# Patient Record
Sex: Female | Born: 1940 | Race: White | Hispanic: No | Marital: Single | Smoking: Former smoker
Health system: Southern US, Community
[De-identification: ages and names within clinical notes are randomized; demographics above are authoritative.]

## PROBLEM LIST (undated history)

## (undated) ENCOUNTER — Inpatient Hospital Stay (AMBULATORY_SURGERY_CENTER): Payer: Medicare Other | Admitting: Podiatry

## (undated) DIAGNOSIS — Z8719 Personal history of other diseases of the digestive system: Secondary | ICD-10-CM

## (undated) DIAGNOSIS — F329 Major depressive disorder, single episode, unspecified: Secondary | ICD-10-CM

## (undated) DIAGNOSIS — I10 Essential (primary) hypertension: Secondary | ICD-10-CM

## (undated) DIAGNOSIS — D649 Anemia, unspecified: Secondary | ICD-10-CM

## (undated) DIAGNOSIS — J45909 Unspecified asthma, uncomplicated: Secondary | ICD-10-CM

## (undated) DIAGNOSIS — F32A Depression, unspecified: Secondary | ICD-10-CM

## (undated) HISTORY — DX: Unspecified asthma, uncomplicated: J45.909

## (undated) HISTORY — PX: NASAL SINUS SURGERY: SHX719

## (undated) HISTORY — PX: TONSILLECTOMY: SUR1361

## (undated) HISTORY — PX: BACK SURGERY: SHX140

---

## 1972-01-19 HISTORY — PX: CHOLECYSTECTOMY: SHX55

## 1977-01-18 HISTORY — PX: ABDOMINAL HYSTERECTOMY: SHX81

## 1992-01-19 HISTORY — PX: HAND TENDON SURGERY: SHX663

## 1998-01-18 HISTORY — PX: ROUX-EN-Y PROCEDURE: SUR1287

## 1999-01-19 HISTORY — PX: EYE SURGERY: SHX253

## 2005-11-11 ENCOUNTER — Ambulatory Visit (HOSPITAL_COMMUNITY): Admission: RE | Admit: 2005-11-11 | Discharge: 2005-11-11 | Payer: Self-pay | Admitting: Neurosurgery

## 2005-11-26 ENCOUNTER — Ambulatory Visit (HOSPITAL_COMMUNITY): Admission: RE | Admit: 2005-11-26 | Discharge: 2005-11-27 | Payer: Self-pay | Admitting: Neurosurgery

## 2010-01-18 HISTORY — PX: HAMMER TOE SURGERY: SHX385

## 2014-09-02 ENCOUNTER — Other Ambulatory Visit: Payer: Self-pay | Admitting: Orthopedic Surgery

## 2014-09-02 DIAGNOSIS — M5136 Other intervertebral disc degeneration, lumbar region: Secondary | ICD-10-CM

## 2014-09-10 ENCOUNTER — Ambulatory Visit
Admission: RE | Admit: 2014-09-10 | Discharge: 2014-09-10 | Disposition: A | Discharge status: Home / Self Care | Payer: Medicare Other | Source: Ambulatory Visit | Attending: Orthopedic Surgery | Admitting: Orthopedic Surgery

## 2014-09-10 DIAGNOSIS — M5136 Other intervertebral disc degeneration, lumbar region: Secondary | ICD-10-CM

## 2014-09-10 MED ORDER — DIAZEPAM 5 MG PO TABS
5.0000 mg | ORAL_TABLET | Freq: Once | ORAL | Status: AC
  Administered 2014-09-10: 5 mg via ORAL

## 2014-09-10 MED ORDER — IOHEXOL 180 MG/ML  SOLN
17.0000 mL | Freq: Once | INTRAMUSCULAR | Status: DC | PRN
  Administered 2014-09-10: 17 mL via INTRATHECAL

## 2014-09-10 NOTE — Discharge Instructions (Signed)

## 2015-02-12 ENCOUNTER — Other Ambulatory Visit: Payer: Self-pay | Admitting: Neurosurgery

## 2015-02-19 ENCOUNTER — Other Ambulatory Visit (HOSPITAL_COMMUNITY): Payer: Self-pay | Admitting: *Deleted

## 2015-02-19 ENCOUNTER — Encounter (HOSPITAL_COMMUNITY): Payer: Self-pay

## 2015-02-19 ENCOUNTER — Encounter (HOSPITAL_COMMUNITY)
Admission: RE | Admit: 2015-02-19 | Discharge: 2015-02-19 | Disposition: A | Discharge status: Home / Self Care | Payer: Medicare Other | Source: Ambulatory Visit | Attending: Neurosurgery | Admitting: Neurosurgery

## 2015-02-19 DIAGNOSIS — M4316 Spondylolisthesis, lumbar region: Secondary | ICD-10-CM | POA: Diagnosis not present

## 2015-02-19 DIAGNOSIS — Z79899 Other long term (current) drug therapy: Secondary | ICD-10-CM | POA: Insufficient documentation

## 2015-02-19 DIAGNOSIS — Z01818 Encounter for other preprocedural examination: Secondary | ICD-10-CM | POA: Diagnosis not present

## 2015-02-19 DIAGNOSIS — Z0183 Encounter for blood typing: Secondary | ICD-10-CM | POA: Insufficient documentation

## 2015-02-19 DIAGNOSIS — Z87891 Personal history of nicotine dependence: Secondary | ICD-10-CM | POA: Diagnosis not present

## 2015-02-19 DIAGNOSIS — Z01812 Encounter for preprocedural laboratory examination: Secondary | ICD-10-CM | POA: Insufficient documentation

## 2015-02-19 DIAGNOSIS — I10 Essential (primary) hypertension: Secondary | ICD-10-CM | POA: Insufficient documentation

## 2015-02-19 HISTORY — DX: Anemia, unspecified: D64.9

## 2015-02-19 HISTORY — DX: Essential (primary) hypertension: I10

## 2015-02-19 HISTORY — DX: Personal history of other diseases of the digestive system: Z87.19

## 2015-02-19 HISTORY — DX: Depression, unspecified: F32.A

## 2015-02-19 HISTORY — DX: Major depressive disorder, single episode, unspecified: F32.9

## 2015-02-19 LAB — CBC
HEMATOCRIT: 39.9 % (ref 36.0–46.0)
HEMOGLOBIN: 13.1 g/dL (ref 12.0–15.0)
MCH: 28.6 pg (ref 26.0–34.0)
MCHC: 32.8 g/dL (ref 30.0–36.0)
MCV: 87.1 fL (ref 78.0–100.0)
Platelets: 309 10*3/uL (ref 150–400)
RBC: 4.58 MIL/uL (ref 3.87–5.11)
RDW: 13.5 % (ref 11.5–15.5)
WBC: 7.5 10*3/uL (ref 4.0–10.5)

## 2015-02-19 LAB — BASIC METABOLIC PANEL
ANION GAP: 8 (ref 5–15)
BUN: <5 mg/dL — ABNORMAL LOW (ref 6–20)
CHLORIDE: 104 mmol/L (ref 101–111)
CO2: 28 mmol/L (ref 22–32)
Calcium: 9.2 mg/dL (ref 8.9–10.3)
Creatinine, Ser: 0.71 mg/dL (ref 0.44–1.00)
GFR calc non Af Amer: >60 mL/min (ref >60)
Glucose, Bld: 101 mg/dL — ABNORMAL HIGH (ref 65–99)
POTASSIUM: 3.6 mmol/L (ref 3.5–5.1)
SODIUM: 140 mmol/L (ref 135–145)

## 2015-02-19 LAB — SURGICAL PCR SCREEN
MRSA, PCR: NEGATIVE
Staphylococcus aureus: NEGATIVE

## 2015-02-19 LAB — TYPE AND SCREEN
ABO/RH(D): O POS
Antibody Screen: NEGATIVE

## 2015-02-19 LAB — ABO/RH: ABO/RH(D): O POS

## 2015-02-19 NOTE — Pre-Procedure Instructions (Signed)
Angela Davidson  02/19/2015     Your procedure is scheduled on Thursday, February 27, 2015 at 12:35 PM.   Report to [facility] Entrance "A" Admitting Office at 10:30 AM.   Call this number if you have problems the morning of surgery: [phone]   Any questions prior to day of surgery, please call [phone] between 8 & 4 PM.   Remember:  Do not eat food or drink liquids after midnight Wednesday, 02/26/15.    Do not wear jewelry, make-up or nail polish.  Do not wear lotions, powders, or perfumes.  You may wear deodorant.  Do not shave 48 hours prior to surgery.    Do not bring valuables to the hospital.  [facility] is not responsible for any belongings or valuables.  Contacts, dentures or bridgework may not be worn into surgery.  Leave your suitcase in the car.  After surgery it may be brought to your room.  For patients admitted to the hospital, discharge time will be determined by your treatment team.  Special instructions:  [facility] - Preparing for Surgery  Before surgery, you can play an important role.  Because skin is not sterile, your skin needs to be as free of germs as possible.  You can reduce the number of germs on you skin by washing with CHG (chlorahexidine gluconate) soap before surgery.  CHG is an antiseptic cleaner which kills germs and bonds with the skin to continue killing germs even after washing.  Please DO NOT use if you have an allergy to CHG or antibacterial soaps.  If your skin becomes reddened/irritated stop using the CHG and inform your nurse when you arrive at Short Stay.  Do not shave (including legs and underarms) for at least 48 hours prior to the first CHG shower.  You may shave your face.  Please follow these instructions carefully:   1.  Shower with CHG Soap the night before surgery and the                                morning of Surgery.  2.  If you choose to wash your hair, wash your hair first as usual with your       normal  shampoo.  3.  After you shampoo, rinse your hair and body thoroughly to remove the                      Shampoo.  4.  Use CHG as you would any other liquid soap.  You can apply chg directly       to the skin and wash gently with scrungie or a clean washcloth.  5.  Apply the CHG Soap to your body ONLY FROM THE NECK DOWN.        Do not use on open wounds or open sores.  Avoid contact with your eyes, ears, mouth and genitals (private parts).  Wash genitals (private parts) with your normal soap.  6.  Wash thoroughly, paying special attention to the area where your surgery        will be performed.  7.  Thoroughly rinse your body with warm water from the neck down.  8.  DO NOT shower/wash with your normal soap after using and rinsing off       the CHG Soap.  9.  Pat yourself dry with a clean towel.  10.  Wear clean pajamas.            11.  Place clean sheets on your bed the night of your first shower and do not        sleep with pets.  Day of Surgery  Do not apply any lotions the morning of surgery.  Please wear clean clothes to the hospital.   Please read over the following fact sheets that you were given. Pain Booklet, Coughing and Deep Breathing, Blood Transfusion Information, MRSA Information and Surgical Site Infection Prevention

## 2015-02-19 NOTE — Progress Notes (Signed)
Pt denies cardiac history, chest pain or sob. 

## 2015-02-20 NOTE — Progress Notes (Addendum)
Anesthesia Chart Review:  Pt is a 75 year old female scheduled for L4-5 PLIF on 02/27/2015 with Dr. Christella Noa.   PCP is Dr. Gilford Rile.  PMH includes:  HTN, anemia. Former smoker. BMI 23.5  Medications include: losartan.  Preoperative labs reviewed.    EKG 02/19/15: NSR. Non-specific ST-t changes. Poor R wave progression. Since previous tracing 11/24/05 lateral ST segments are depressed  If no changes, I anticipate pt can proceed with surgery as scheduled.   Willeen Cass, FNP-BC [facility] Short Stay Surgical Center/Anesthesiology Phone: [phone] 02/20/2015 2:40 PM

## 2015-02-26 MED ORDER — CEFAZOLIN SODIUM-DEXTROSE 2-3 GM-% IV SOLR
2.0000 g | INTRAVENOUS | Status: AC
  Administered 2015-02-27: 2 g via INTRAVENOUS
  Filled 2015-02-26: qty 50

## 2015-02-27 ENCOUNTER — Encounter (HOSPITAL_COMMUNITY)
Admission: RE | Disposition: A | Discharge status: Home / Self Care | Payer: Self-pay | Source: Ambulatory Visit | Attending: Neurosurgery

## 2015-02-27 ENCOUNTER — Inpatient Hospital Stay (HOSPITAL_COMMUNITY): Payer: Medicare Other | Admitting: Anesthesiology

## 2015-02-27 ENCOUNTER — Inpatient Hospital Stay (HOSPITAL_COMMUNITY): Payer: Medicare Other | Admitting: Emergency Medicine

## 2015-02-27 ENCOUNTER — Inpatient Hospital Stay (HOSPITAL_COMMUNITY)
Admission: RE | Admit: 2015-02-27 | Discharge: 2015-03-02 | DRG: 460 | Disposition: A | Discharge status: Home / Self Care | Payer: Medicare Other | Source: Ambulatory Visit | Attending: Neurosurgery | Admitting: Neurosurgery

## 2015-02-27 ENCOUNTER — Encounter (HOSPITAL_COMMUNITY): Payer: Self-pay | Admitting: Anesthesiology

## 2015-02-27 ENCOUNTER — Inpatient Hospital Stay (HOSPITAL_COMMUNITY): Payer: Medicare Other

## 2015-02-27 DIAGNOSIS — Z90710 Acquired absence of both cervix and uterus: Secondary | ICD-10-CM

## 2015-02-27 DIAGNOSIS — M4806 Spinal stenosis, lumbar region: Principal | ICD-10-CM | POA: Diagnosis present

## 2015-02-27 DIAGNOSIS — Z9849 Cataract extraction status, unspecified eye: Secondary | ICD-10-CM

## 2015-02-27 DIAGNOSIS — Z419 Encounter for procedure for purposes other than remedying health state, unspecified: Secondary | ICD-10-CM

## 2015-02-27 DIAGNOSIS — Z9884 Bariatric surgery status: Secondary | ICD-10-CM

## 2015-02-27 DIAGNOSIS — Z8249 Family history of ischemic heart disease and other diseases of the circulatory system: Secondary | ICD-10-CM

## 2015-02-27 DIAGNOSIS — M549 Dorsalgia, unspecified: Secondary | ICD-10-CM | POA: Diagnosis present

## 2015-02-27 DIAGNOSIS — M4316 Spondylolisthesis, lumbar region: Secondary | ICD-10-CM | POA: Diagnosis present

## 2015-02-27 DIAGNOSIS — I10 Essential (primary) hypertension: Secondary | ICD-10-CM | POA: Diagnosis present

## 2015-02-27 SURGERY — POSTERIOR LUMBAR FUSION 1 LEVEL
Anesthesia: General

## 2015-02-27 MED ORDER — VITAMIN D 1000 UNITS PO TABS
5000.0000 [IU] | ORAL_TABLET | Freq: Every day | ORAL | Status: DC
  Administered 2015-02-27 – 2015-03-02 (×4): 5000 [IU] via ORAL
  Filled 2015-02-27 (×4): qty 5

## 2015-02-27 MED ORDER — HYDROMORPHONE HCL 1 MG/ML IJ SOLN
0.2500 mg | INTRAMUSCULAR | Status: DC | PRN
  Administered 2015-02-27 (×2): 0.5 mg via INTRAVENOUS

## 2015-02-27 MED ORDER — 0.9 % SODIUM CHLORIDE (POUR BTL) OPTIME
TOPICAL | Status: DC | PRN
  Administered 2015-02-27: 1000 mL

## 2015-02-27 MED ORDER — ACETAMINOPHEN 325 MG PO TABS
650.0000 mg | ORAL_TABLET | ORAL | Status: DC | PRN
  Administered 2015-03-01 – 2015-03-02 (×3): 650 mg via ORAL
  Filled 2015-02-27 (×3): qty 2

## 2015-02-27 MED ORDER — SENNOSIDES-DOCUSATE SODIUM 8.6-50 MG PO TABS: 1.0000 | ORAL_TABLET | Freq: Every evening | ORAL | Status: DC | PRN

## 2015-02-27 MED ORDER — ONDANSETRON HCL 4 MG/2ML IJ SOLN
INTRAMUSCULAR | Status: AC
  Filled 2015-02-27: qty 2

## 2015-02-27 MED ORDER — DOCUSATE SODIUM 100 MG PO CAPS
100.0000 mg | ORAL_CAPSULE | Freq: Two times a day (BID) | ORAL | Status: DC
  Administered 2015-02-27 – 2015-03-02 (×6): 100 mg via ORAL
  Filled 2015-02-27 (×6): qty 1

## 2015-02-27 MED ORDER — FENTANYL CITRATE (PF) 250 MCG/5ML IJ SOLN
INTRAMUSCULAR | Status: AC
  Filled 2015-02-27: qty 5

## 2015-02-27 MED ORDER — TRAMADOL HCL 50 MG PO TABS: 50.0000 mg | ORAL_TABLET | Freq: Four times a day (QID) | ORAL | Status: DC | PRN

## 2015-02-27 MED ORDER — ROCURONIUM BROMIDE 50 MG/5ML IV SOLN
INTRAVENOUS | Status: AC
  Filled 2015-02-27: qty 1

## 2015-02-27 MED ORDER — POTASSIUM CHLORIDE IN NACL 20-0.9 MEQ/L-% IV SOLN
INTRAVENOUS | Status: DC
  Administered 2015-02-27: 1000 mL via INTRAVENOUS
  Filled 2015-02-27: qty 1000

## 2015-02-27 MED ORDER — PHENOL 1.4 % MT LIQD
1.0000 | OROMUCOSAL | Status: DC | PRN
Start: 2015-02-27 — End: 2015-03-02

## 2015-02-27 MED ORDER — LACTATED RINGERS IV SOLN
INTRAVENOUS | Status: DC
  Administered 2015-02-27 (×5): via INTRAVENOUS

## 2015-02-27 MED ORDER — HYDROMORPHONE HCL 1 MG/ML IJ SOLN
0.5000 mg | INTRAMUSCULAR | Status: DC | PRN
  Administered 2015-02-27: 1 mg via INTRAVENOUS
  Filled 2015-02-27: qty 1

## 2015-02-27 MED ORDER — SODIUM CHLORIDE 0.9% FLUSH
3.0000 mL | Freq: Two times a day (BID) | INTRAVENOUS | Status: DC
  Administered 2015-02-27 – 2015-03-02 (×5): 3 mL via INTRAVENOUS

## 2015-02-27 MED ORDER — BIOTIN 10 MG PO TABS: 1.0000 | ORAL_TABLET | Freq: Every day | ORAL | Status: DC

## 2015-02-27 MED ORDER — LIDOCAINE HCL (CARDIAC) 20 MG/ML IV SOLN
INTRAVENOUS | Status: DC | PRN
  Administered 2015-02-27: 50 mg via INTRAVENOUS

## 2015-02-27 MED ORDER — ONDANSETRON HCL 4 MG/2ML IJ SOLN: 4.0000 mg | INTRAMUSCULAR | Status: DC | PRN

## 2015-02-27 MED ORDER — ACETAMINOPHEN 650 MG RE SUPP: 650.0000 mg | RECTAL | Status: DC | PRN

## 2015-02-27 MED ORDER — PHENYLEPHRINE HCL 10 MG/ML IJ SOLN
INTRAMUSCULAR | Status: AC
  Filled 2015-02-27: qty 1

## 2015-02-27 MED ORDER — ROCURONIUM BROMIDE 100 MG/10ML IV SOLN
INTRAVENOUS | Status: DC | PRN
  Administered 2015-02-27: 50 mg via INTRAVENOUS

## 2015-02-27 MED ORDER — ONDANSETRON HCL 4 MG/2ML IJ SOLN
INTRAMUSCULAR | Status: DC | PRN
  Administered 2015-02-27: 4 mg via INTRAVENOUS

## 2015-02-27 MED ORDER — HYDROCODONE-ACETAMINOPHEN 5-325 MG PO TABS: 1.0000 | ORAL_TABLET | ORAL | Status: DC | PRN

## 2015-02-27 MED ORDER — SODIUM CHLORIDE 0.9 % IV SOLN: 250.0000 mL | INTRAVENOUS | Status: DC

## 2015-02-27 MED ORDER — PROPOFOL 10 MG/ML IV BOLUS
INTRAVENOUS | Status: AC
  Filled 2015-02-27: qty 20

## 2015-02-27 MED ORDER — OXYCODONE-ACETAMINOPHEN 5-325 MG PO TABS
1.0000 | ORAL_TABLET | ORAL | Status: DC | PRN
  Administered 2015-02-28 (×2): 1 via ORAL
  Filled 2015-02-27: qty 1
  Filled 2015-02-27: qty 2

## 2015-02-27 MED ORDER — MIDAZOLAM HCL 2 MG/2ML IJ SOLN
INTRAMUSCULAR | Status: AC
  Filled 2015-02-27: qty 2

## 2015-02-27 MED ORDER — LOSARTAN POTASSIUM 50 MG PO TABS
50.0000 mg | ORAL_TABLET | Freq: Every day | ORAL | Status: DC
Start: 2015-02-27 — End: 2015-03-02
  Administered 2015-02-27 – 2015-03-02 (×3): 50 mg via ORAL
  Filled 2015-02-27 (×4): qty 1

## 2015-02-27 MED ORDER — METOCLOPRAMIDE HCL 5 MG/ML IJ SOLN
INTRAMUSCULAR | Status: AC
  Filled 2015-02-27: qty 2

## 2015-02-27 MED ORDER — LIDOCAINE-EPINEPHRINE 0.5 %-1:200000 IJ SOLN
INTRAMUSCULAR | Status: DC | PRN
  Administered 2015-02-27: 20 mL

## 2015-02-27 MED ORDER — MENTHOL 3 MG MT LOZG: 1.0000 | LOZENGE | OROMUCOSAL | Status: DC | PRN

## 2015-02-27 MED ORDER — CALCIUM CARBONATE-VITAMIN D 500-200 MG-UNIT PO TABS
1.0000 | ORAL_TABLET | Freq: Every day | ORAL | Status: DC
  Administered 2015-02-27 – 2015-03-02 (×4): 1 via ORAL
  Filled 2015-02-27 (×7): qty 1

## 2015-02-27 MED ORDER — DEXAMETHASONE SODIUM PHOSPHATE 4 MG/ML IJ SOLN
INTRAMUSCULAR | Status: DC | PRN
  Administered 2015-02-27: 8 mg via INTRAVENOUS

## 2015-02-27 MED ORDER — VITAMIN B-12 1000 MCG PO TABS
1000.0000 ug | ORAL_TABLET | Freq: Every day | ORAL | Status: DC
  Administered 2015-02-27 – 2015-03-02 (×4): 1000 ug via ORAL
  Filled 2015-02-27 (×4): qty 1

## 2015-02-27 MED ORDER — LORAZEPAM 1 MG PO TABS
1.0000 mg | ORAL_TABLET | Freq: Every day | ORAL | Status: DC
  Administered 2015-02-27 – 2015-03-01 (×3): 1 mg via ORAL
  Filled 2015-02-27 (×3): qty 1

## 2015-02-27 MED ORDER — SODIUM CHLORIDE 0.9% FLUSH: 3.0000 mL | INTRAVENOUS | Status: DC | PRN

## 2015-02-27 MED ORDER — DEXAMETHASONE SODIUM PHOSPHATE 4 MG/ML IJ SOLN
INTRAMUSCULAR | Status: AC
  Filled 2015-02-27: qty 2

## 2015-02-27 MED ORDER — CYCLOBENZAPRINE HCL 10 MG PO TABS
ORAL_TABLET | ORAL | Status: AC
Start: 2015-02-27 — End: 2015-02-27
  Administered 2015-02-27: 10 mg
  Filled 2015-02-27: qty 1

## 2015-02-27 MED ORDER — THROMBIN 20000 UNITS EX SOLR
CUTANEOUS | Status: DC | PRN
  Administered 2015-02-27: 12:00:00 via TOPICAL

## 2015-02-27 MED ORDER — CYCLOBENZAPRINE HCL 10 MG PO TABS
10.0000 mg | ORAL_TABLET | Freq: Three times a day (TID) | ORAL | Status: DC | PRN
  Administered 2015-02-27: 10 mg via ORAL

## 2015-02-27 MED ORDER — ESCITALOPRAM OXALATE 10 MG PO TABS
5.0000 mg | ORAL_TABLET | Freq: Every day | ORAL | Status: DC
  Administered 2015-02-27 – 2015-03-01 (×3): 5 mg via ORAL
  Filled 2015-02-27 (×3): qty 1

## 2015-02-27 MED ORDER — FENTANYL CITRATE (PF) 100 MCG/2ML IJ SOLN
INTRAMUSCULAR | Status: DC | PRN
  Administered 2015-02-27 (×2): 50 ug via INTRAVENOUS
  Administered 2015-02-27 (×2): 100 ug via INTRAVENOUS
  Administered 2015-02-27 (×2): 50 ug via INTRAVENOUS

## 2015-02-27 MED ORDER — LIDOCAINE HCL (CARDIAC) 20 MG/ML IV SOLN
INTRAVENOUS | Status: AC
  Filled 2015-02-27: qty 5

## 2015-02-27 MED ORDER — SUGAMMADEX SODIUM 500 MG/5ML IV SOLN
INTRAVENOUS | Status: DC | PRN
  Administered 2015-02-27: 100 mg via INTRAVENOUS

## 2015-02-27 MED ORDER — PHENYLEPHRINE HCL 10 MG/ML IJ SOLN
10.0000 mg | INTRAVENOUS | Status: DC | PRN
  Administered 2015-02-27: 50 ug/min via INTRAVENOUS

## 2015-02-27 MED ORDER — PHENYLEPHRINE HCL 10 MG/ML IJ SOLN
INTRAMUSCULAR | Status: DC | PRN
  Administered 2015-02-27: 80 ug via INTRAVENOUS
  Administered 2015-02-27: 40 ug via INTRAVENOUS
  Administered 2015-02-27: 80 ug via INTRAVENOUS

## 2015-02-27 MED ORDER — PROPOFOL 10 MG/ML IV BOLUS
INTRAVENOUS | Status: DC | PRN
  Administered 2015-02-27: 130 mg via INTRAVENOUS

## 2015-02-27 MED ORDER — METOCLOPRAMIDE HCL 5 MG/ML IJ SOLN
10.0000 mg | Freq: Once | INTRAMUSCULAR | Status: AC | PRN
  Administered 2015-02-27: 10 mg via INTRAVENOUS

## 2015-02-27 MED ORDER — ALBUMIN HUMAN 5 % IV SOLN
INTRAVENOUS | Status: DC | PRN
  Administered 2015-02-27: 14:00:00 via INTRAVENOUS

## 2015-02-27 MED ORDER — HYDROMORPHONE HCL 1 MG/ML IJ SOLN
INTRAMUSCULAR | Status: AC
  Filled 2015-02-27: qty 1

## 2015-02-27 MED ORDER — MEPERIDINE HCL 25 MG/ML IJ SOLN: 6.2500 mg | INTRAMUSCULAR | Status: DC | PRN

## 2015-02-27 MED ORDER — MAGNESIUM CITRATE PO SOLN
1.0000 | Freq: Once | ORAL | Status: AC | PRN
  Administered 2015-02-28: 1 via ORAL
  Filled 2015-02-27: qty 296

## 2015-02-27 MED ORDER — MIDAZOLAM HCL 5 MG/5ML IJ SOLN
INTRAMUSCULAR | Status: DC | PRN
  Administered 2015-02-27: 1 mg via INTRAVENOUS

## 2015-02-27 MED ORDER — BISACODYL 5 MG PO TBEC: 5.0000 mg | DELAYED_RELEASE_TABLET | Freq: Every day | ORAL | Status: DC | PRN

## 2015-02-27 SURGICAL SUPPLY — 65 items
BENZOIN TINCTURE PRP APPL 2/3 (GAUZE/BANDAGES/DRESSINGS) IMPLANT
BIT DRILL PLIF MAS DISP 5.5MM (DRILL) ×1 IMPLANT
BLADE CLIPPER SURG (BLADE) IMPLANT
BONE MATRIX VIVIGEN 5CC (Bone Implant) ×3 IMPLANT
BUR MATCHSTICK NEURO 3.0 LAGG (BURR) ×3 IMPLANT
BUR PRECISION FLUTE 5.0 (BURR) ×3 IMPLANT
CANISTER SUCT 3000ML PPV (MISCELLANEOUS) ×3 IMPLANT
CLOSURE WOUND 1/2 X4 (GAUZE/BANDAGES/DRESSINGS)
CONT SPEC 4OZ CLIKSEAL STRL BL (MISCELLANEOUS) ×3 IMPLANT
COVER BACK TABLE 60X90IN (DRAPES) ×3 IMPLANT
DECANTER SPIKE VIAL GLASS SM (MISCELLANEOUS) ×3 IMPLANT
DRAPE C-ARM 42X72 X-RAY (DRAPES) ×6 IMPLANT
DRAPE C-ARMOR (DRAPES) ×6 IMPLANT
DRAPE LAPAROTOMY 100X72X124 (DRAPES) ×3 IMPLANT
DRAPE POUCH INSTRU U-SHP 10X18 (DRAPES) ×3 IMPLANT
DRAPE SURG 17X23 STRL (DRAPES) ×3 IMPLANT
DRILL PLIF MAS DISP 5.5MM (DRILL) ×3
DURAPREP 26ML APPLICATOR (WOUND CARE) ×3 IMPLANT
ELECT REM PT RETURN 9FT ADLT (ELECTROSURGICAL) ×3
ELECTRODE REM PT RTRN 9FT ADLT (ELECTROSURGICAL) ×1 IMPLANT
GAUZE SPONGE 4X4 12PLY STRL (GAUZE/BANDAGES/DRESSINGS) IMPLANT
GAUZE SPONGE 4X4 16PLY XRAY LF (GAUZE/BANDAGES/DRESSINGS) IMPLANT
GLOVE ECLIPSE 6.5 STRL STRAW (GLOVE) ×6 IMPLANT
GLOVE EXAM NITRILE LRG STRL (GLOVE) IMPLANT
GLOVE EXAM NITRILE MD LF STRL (GLOVE) IMPLANT
GLOVE EXAM NITRILE XL STR (GLOVE) IMPLANT
GLOVE EXAM NITRILE XS STR PU (GLOVE) IMPLANT
GLOVE INDICATOR 7.0 STRL GRN (GLOVE) ×6 IMPLANT
GLOVE INDICATOR 7.5 STRL GRN (GLOVE) ×6 IMPLANT
GOWN STRL REUS W/ TWL LRG LVL3 (GOWN DISPOSABLE) ×2 IMPLANT
GOWN STRL REUS W/ TWL XL LVL3 (GOWN DISPOSABLE) ×1 IMPLANT
GOWN STRL REUS W/TWL 2XL LVL3 (GOWN DISPOSABLE) IMPLANT
GOWN STRL REUS W/TWL LRG LVL3 (GOWN DISPOSABLE) ×4
GOWN STRL REUS W/TWL XL LVL3 (GOWN DISPOSABLE) ×2
IV LACTATED RINGERS 500ML (IV SOLUTION) ×3 IMPLANT
KIT BASIN OR (CUSTOM PROCEDURE TRAY) ×3 IMPLANT
KIT POSITION SURG JACKSON T1 (MISCELLANEOUS) ×3 IMPLANT
KIT ROOM TURNOVER OR (KITS) ×3 IMPLANT
LIQUID BAND (GAUZE/BANDAGES/DRESSINGS) ×3 IMPLANT
NEEDLE HYPO 25X1 1.5 SAFETY (NEEDLE) ×3 IMPLANT
NEEDLE SPNL 18GX3.5 QUINCKE PK (NEEDLE) ×3 IMPLANT
NS IRRIG 1000ML POUR BTL (IV SOLUTION) ×6 IMPLANT
PACK LAMINECTOMY NEURO (CUSTOM PROCEDURE TRAY) ×3 IMPLANT
PAD ARMBOARD 7.5X6 YLW CONV (MISCELLANEOUS) ×6 IMPLANT
ROD 30MM (Rod) ×4 IMPLANT
ROD PLIF MAS PB SPHERX 30 (Rod) ×2 IMPLANT
SCREW LOCK (Screw) ×8 IMPLANT
SCREW LOCK FXNS SPNE MAS PL (Screw) ×4 IMPLANT
SCREW SHANK 5.0X35 (Screw) ×3 IMPLANT
SCREW SHANK 5.5X30MM (Screw) ×6 IMPLANT
SCREW SHANKS 5.5X35 (Screw) ×3 IMPLANT
SCREW TULIP 5.5 (Screw) ×12 IMPLANT
SPACER OPAL REVOLVE 10MM ×6 IMPLANT
SPONGE LAP 4X18 X RAY DECT (DISPOSABLE) IMPLANT
SPONGE SURGIFOAM ABS GEL 100 (HEMOSTASIS) ×3 IMPLANT
STRIP CLOSURE SKIN 1/2X4 (GAUZE/BANDAGES/DRESSINGS) IMPLANT
SUT PROLENE 6 0 BV (SUTURE) IMPLANT
SUT VIC AB 0 CT1 18XCR BRD8 (SUTURE) ×2 IMPLANT
SUT VIC AB 0 CT1 8-18 (SUTURE) ×4
SUT VIC AB 2-0 CT1 18 (SUTURE) ×3 IMPLANT
SUT VIC AB 3-0 SH 8-18 (SUTURE) ×3 IMPLANT
TOWEL OR 17X24 6PK STRL BLUE (TOWEL DISPOSABLE) ×3 IMPLANT
TOWEL OR 17X26 10 PK STRL BLUE (TOWEL DISPOSABLE) ×3 IMPLANT
TRAY FOLEY W/METER SILVER 14FR (SET/KITS/TRAYS/PACK) ×3 IMPLANT
WATER STERILE IRR 1000ML POUR (IV SOLUTION) ×3 IMPLANT

## 2015-02-27 NOTE — H&P (Signed)
BP 159/76 mmHg  Pulse 58  Temp(Src) 98.3 F (36.8 C) (Oral)  Resp 20  Ht 5\' 1"  (1.549 m)  Wt 56.246 kg (124 lb)  BMI 23.44 kg/m2  SpO2 100%   Angela Davidson is a patient of mine who I did a lumbar decompression on in 2007 at L4-5.  She returned today for a history of pain in the hip since 06/2014.  There was no antecede in trauma.  She has significant pain in both hips, and pain that runs into the groin on the right side.  She was seen and evaluated by Dr. Gladstone Lighter.  The evaluation included a myelogram/post myelogram CT, which did show a spondylolisthesis with movement on flexion and extension films.  She is mildly stenotic at this area, and does have some again, mild foraminal compromise.  Given these findings, it seems that the L4-5 region is the one of most interest.  She does not have pain that radiates to the knees on either side.  She says that the pain that she feels in the right groin, used to be pain that was going into the left groin.  She is 75 years of age, retired, and right-handed.        REVIEW OF SYSTEMS:                        Review of systems is positive for eyeglasses, hypertension, leg pain with walking, back pain, leg pain, and depression.  She denies allergic, hematologic, endocrine, neurological, skin, genitourinary, gastrointestinal, respiratory, ear, nose, throat, mouth, or constitutional problems.  On her pain chart she lists pain across the back and into the hips.  She has pain also anteriorly in her right medial thigh.      She says she was evaluated by Dr. Gladstone Lighter for her hips.  I actually had done an MRI of both hips in 2007, at that time she had some mild tendinitis in the gluteus.  She says she was simply getting out of bed when this pain started.     PAST MEDICAL HISTORY:                    Past medical history also includes hypertension, morbid obesity; she underwent a gastric bypass many years ago, and is obviously doing very well.  She had a tumor on the left  side of her neck, she did not characterize it any way.     PAST SURGICAL HISTORY:                  Past surgical history includes a distraction of lower teeth, hammertoe deviation of left foot fourth and fifth toes, nasal surgery, L4-5 lumbar decompression in 11/2005.  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      FAMILY HISTORY:                                Mother and father are both deceased.  Diabetes, hypertension, emphysema, and cerebrovascular disease are present in her family history.     ALLERGIES:  ALLERGIES TO HYDROCODONE, MORPHINE CAUSE SEVERE HEADACHES AND NAUSEA.  VICRYL SURGICAL THREAD SHE SAYS CAUSES BLISTERS.      CURRENT MEDICATIONS:         Lorazepam, escitalopram, Losartan, alendronate.  She takes Biotin, vitamin B,12, calcium 600 plus B3 vitamin, vitamin D3 5000 IU.     SOCIAL HISTORY:                    She does not drink.  She does not use alcohol.  She does not use illicit drugs.     PHYSICAL EXAMINATION:        Vital signs are as follows, height 5 feet 1 inch, weight 125.2 pounds, temperature is 97 degrees Fahrenheit, blood pressure is 134/72, pulse 69, respiratory rate is 16.     On exam she is alert, oriented x4, and answering all questions appropriately.  Memory, language, attention span, and fund of knowledge are normal.  Speech is clear, it is also fluent.  Pupils are equal, round, and reactive to light.  Full extraocular movements, full visual fields.  Hearing intact to voice.  Symmetric facial sensation and movement.  Uvula elevates in the midline.  Shoulder shrug is normal.  Tongue protrudes in the midline.  5/5 strength in both upper and lower extremities.   Normal muscle tone, bulk, and coordination.  Reflexes 2+ at biceps, triceps, brachioradialis, knees, and ankles.  She has an intact proprioception in the upper and lower extremities.  Gait is normal.  She can toe walk and heel walk with ease.  Romberg test is negative.     IMAGING STUDIES:                  Myelogram/postmyelogram CT shows spondylolisthesis at L4-5, air in the disc space, and significant facet arthropathy at that level also.  There is some facet arthropathy at L5-S1. Comparing this to the myelogram that I performed in 2007, there is clear advancement.  There was no spondylolisthesis at L4-5 at that time.  L3-4  has some spondylitic change present today, also some facet arthropathy, but by far, the worst is L4-5 and some at L5-S1.   L5-S1 does not show so many problems as L4-5 does.      IMPRESSION/PLAN:                             This is why I believe she does have the back pain secondary to the significant facet arthropathy at this level.  She is not complaining of radiculopathies.  I do believe a fusion at L4-5 would be helpful secondary to the facet arthropathy, and the fact that that has deteriorated over the last 9 years with clear visual proof.  She would like to proceed with a lumbar fusion.  She has had now treatment since 06/2014 in a conservative fashion without any improvement.  We did offer injections, but she refused stating that they did not help previously, and she does not wish to wait again this time.  She says the pain is bad enough that she does wake up at times in tears.  She did receive a detailed instruction with regards to the procedure.

## 2015-02-27 NOTE — Transfer of Care (Signed)
Immediate Anesthesia Transfer of Care Note  Patient: MARKAILA PECHA  Procedure(s) Performed: Procedure(s): LUMBAR FOUR-FIVE POSTERIOR LUMBAR INTERBODY FUSION WITH INTERBODY PROTHESIS POSTERIOR LATERAL ARTHRODESIS AND POSTERIOR NONSEGMENTAL INSTRUMENTATION (N/A)  Patient Location: PACU  Anesthesia Type:General  Level of Consciousness: awake, alert , oriented and patient cooperative  Airway & Oxygen Therapy: Patient Spontanous Breathing and Patient connected to nasal cannula oxygen  Post-op Assessment: Report given to RN and Post -op Vital signs reviewed and stable  Post vital signs: Reviewed and stable  Last Vitals:  Filed Vitals:   02/27/15 1051  BP: 159/76  Pulse: 58  Temp: 36.8 C  Resp: 20    Complications: No apparent anesthesia complications

## 2015-02-27 NOTE — Anesthesia Procedure Notes (Signed)
Procedure Name: Intubation Date/Time: 02/27/2015 1:53 PM Performed by: Greggory Stallion, Nerea Bordenave L Pre-anesthesia Checklist: Patient identified, Emergency Drugs available, Suction available, Patient being monitored and Timeout performed Patient Re-evaluated:Patient Re-evaluated prior to inductionOxygen Delivery Method: Circle system utilized Preoxygenation: Pre-oxygenation with 100% oxygen Intubation Type: IV induction Ventilation: Mask ventilation without difficulty Laryngoscope Size: Mac and 3 Grade View: Grade II Tube type: Oral Tube size: 7.0 mm Number of attempts: 1 Airway Equipment and Method: Stylet Placement Confirmation: ETT inserted through vocal cords under direct vision,  positive ETCO2 and breath sounds checked- equal and bilateral Secured at: 20 cm Tube secured with: Tape Dental Injury: Teeth and Oropharynx as per pre-operative assessment

## 2015-02-27 NOTE — Anesthesia Preprocedure Evaluation (Addendum)
Anesthesia Evaluation  Patient identified by MRN, date of birth, ID band Patient awake    Reviewed: Allergy & Precautions, NPO status , Patient's Chart, lab work & pertinent test results  Airway Mallampati: I  TM Distance: >3 FB Neck ROM: Full    Dental  (+) Edentulous Lower, Partial Upper   Pulmonary neg pulmonary ROS, former smoker,    Pulmonary exam normal breath sounds clear to auscultation       Cardiovascular hypertension, Pt. on medications Normal cardiovascular exam Rhythm:Regular Rate:Normal     Neuro/Psych PSYCHIATRIC DISORDERS Depression negative neurological ROS     GI/Hepatic Neg liver ROS, Hx/o Roux en Y gastric bypass 2000   Endo/Other  negative endocrine ROS  Renal/GU negative Renal ROS  negative genitourinary   Musculoskeletal Lumbar spondylolisthesis   Abdominal   Peds  Hematology  (+) anemia ,   Anesthesia Other Findings   Reproductive/Obstetrics                            Anesthesia Physical Anesthesia Plan  ASA: II  Anesthesia Plan: General   Post-op Pain Management:    Induction: Intravenous  Airway Management Planned: Oral ETT  Additional Equipment:   Intra-op Plan:   Post-operative Plan: Extubation in OR  Informed Consent: I have reviewed the patients History and Physical, chart, labs and discussed the procedure including the risks, benefits and alternatives for the proposed anesthesia with the patient or authorized representative who has indicated his/her understanding and acceptance.   Dental advisory given  Plan Discussed with: Anesthesiologist, Surgeon and CRNA  Anesthesia Plan Comments:         Anesthesia Quick Evaluation

## 2015-02-27 NOTE — Progress Notes (Signed)
Patient arrived alert and oriented, pain controled vitals stable no dressing incision closed with derma bond CDI, SCD's on she is resting comfortably.

## 2015-02-27 NOTE — Anesthesia Postprocedure Evaluation (Signed)
Anesthesia Post Note  Patient: Angela Davidson  Procedure(s) Performed: Procedure(s) (LRB): LUMBAR FOUR-FIVE POSTERIOR LUMBAR INTERBODY FUSION WITH INTERBODY PROTHESIS POSTERIOR LATERAL ARTHRODESIS AND POSTERIOR NONSEGMENTAL INSTRUMENTATION (N/A)  Patient location during evaluation: PACU Anesthesia Type: General Level of consciousness: sedated Pain management: pain level controlled Vital Signs Assessment: post-procedure vital signs reviewed and stable Respiratory status: spontaneous breathing and respiratory function stable Cardiovascular status: stable Anesthetic complications: no    Last Vitals:  Filed Vitals:   02/27/15 1827 02/27/15 1842  BP: 155/80 134/72  Pulse: 82 83  Temp:    Resp: 8 13    Last Pain:  Filed Vitals:   02/27/15 1843  PainSc: 7                  Marzell Isakson DANIEL

## 2015-02-27 NOTE — Op Note (Signed)
02/27/2015  5:52 PM  PATIENT:  Angela Davidson  75 y.o. 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  PROCEDURE:  Procedure(s): LUMBAR FOUR-FIVE POSTERIOR LUMBAR INTERBODY FUSION WITH INTERBODY PROTHESIS Synthes 40mm x14mm x 2 cages, morcelized autograft, allograft Lumbar decompression beyond the needed exposure for a PLIF l4/5, to decompress the L4, and L5 roots POSTERIOR NONSEGMENTAL INSTRUMENTATION L4/5, Pedicle screws(mas plif nuvasive)  SURGEON:  Surgeon(s): Ashok Pall, MD Eustace Moore, MD  ASSISTANTS:Jones, Shanon Brow  ANESTHESIA:   general  EBL:  Total I/O In: 2850 [I.V.:2600; IV Piggyback:250] Out: 34 [Urine:365; Blood:500]  BLOOD ADMINISTERED:none  CELL SAVER GIVEN:none  COUNT:per nursing  DRAINS: none   SPECIMEN:  No Specimen  DICTATION: Angela Davidson is a 75 y.o. female whom was taken to the operating room intubated, and placed under a general anesthetic without difficulty. A foley catheter was placed under sterile conditions. She was positioned prone on a Jackson stable with all pressure points properly padded.  Her lumbar region was prepped and draped in a sterile manner. I infiltrated 20cc's 1/2%lidocaine/1:2000,000 strength epinephrine into the planned incision. I opened the skin with a 10 blade and took the incision down to the thoracolumbar fascia. I exposed the lamina of L4, and L5 in a subperiosteal fashion bilaterally. I confirmed my location with an intraoperative xray.  I placed self retaining retractors and started the decompression.  I decompressed the spinal canal and the L4, L5 roots via a complete inferior facetectomy of L4 bilaterally. I followed the nerve root laterally through the foramina by removing overlying bone and scar. This was a redo operation. I used the Kerrison punches and the drill to accomplish this. PLIF's were performed  at L4/5 in the same fashion. I opened the disc space with a 15 blade then used a variety of instruments to remove the disc and prepare the space for the arthrodesis. I used curettes, rongeurs, punches, shavers for the disc space, and rasps in the discetomy. I measured the disc space and placed 50mmk  Peek cages(Synthes) into the disc space(s).  I placed pedicle screws at L4, and L5, using fluoroscopic guidance. I drilled a pilot hole, then cannulated the pedicle with a drill at each site. I then tapped each pedicle, assessing each site for pedicle violations. No cutouts were appreciated at final screw placement. I did change the trajectory of the right L4 screw as initially it was too lateral. . Screws (Nuvasive mas plif) were then placed at each site without difficulty.We attached the screw heads, then placed rods through the heads on each side. I placed locking caps and secured the rods with the torque screwdriver.  Final films were performed and all screws appeared to be in good position.  We closed the wound in a layered fashion. We approximated the thoracolumbar fascia, subcutaneous, and subcuticular planes with vicryl sutures. I used Dermabond for a sterile dressing.     PLAN OF CARE: Admit to inpatient   PATIENT DISPOSITION:  PACU - hemodynamically stable.   Delay start of Pharmacological VTE agent (>24hrs) due to surgical blood loss or risk of bleeding:  yes

## 2015-02-28 NOTE — Progress Notes (Signed)
OT Cancellation Note  Patient Details Name: Angela Davidson MRN: [id number] DOB: Mar 28, 1940   Cancelled Treatment:    Reason Eval/Treat Not Completed: Other (comment) (Per PT, no brace in room.) Per PT back brace has not been delivered to room yet and mobility limited to EOB per RN. Will follow up for OT eval as time allows.   Binnie Kand M.S., OTR/L Pager: [phone]  02/28/2015, 11:56 AM

## 2015-02-28 NOTE — Progress Notes (Signed)
Utilization review completed.  

## 2015-02-28 NOTE — Evaluation (Signed)
Occupational Therapy Evaluation Patient Details Name: Angela Davidson MRN: [id number] DOB: 1940-02-11 Today's Date: 02/28/2015    History of Present Illness Pt is a 75 y/o female who presents s/p L4-L5 PLIF on 02/27/15.   Clinical Impression   Pt reports she was independent with ADLs and mobility PTA. Currently pt is overall min assist for functional mobility and ADLs. Began safety, back, and ADL education with pt; she verbalized understanding. Pt able to verbally recall 2/3 back precautions; reviewed all precautions. Pt planning to d/c home with 24/7 supervision from her daughter. Pt would benefit from continued skilled OT to address established goals.    Follow Up Recommendations  No OT follow up;Supervision - Intermittent    Equipment Recommendations  None recommended by OT    Recommendations for Other Services       Precautions / Restrictions Precautions Precautions: Fall;Back Precaution Booklet Issued: No Precaution Comments: Pt able to recall 2/3 precautions at start of session. Educated on all back precautions. Required Braces or Orthoses: Spinal Brace Spinal Brace: Lumbar corset;Applied in sitting position Restrictions Weight Bearing Restrictions: No      Mobility Bed Mobility Overal bed mobility: Needs Assistance Bed Mobility: Rolling;Sidelying to Sit;Sit to Sidelying Rolling: Supervision Sidelying to sit: Supervision     Sit to sidelying: Supervision General bed mobility comments: HOB flat and use of rails. Supervision overall for safety. Good log roll technique.  Transfers Overall transfer level: Needs assistance Equipment used: 1 person hand held assist Transfers: Sit to/from Stand Sit to Stand: Min assist         General transfer comment: Min hand held assist for sit to stand from EOB x 1, BSC x 1.    Balance Overall balance assessment: Needs assistance Sitting-balance support: Feet supported;No upper extremity supported Sitting balance-Leahy Scale:  Good     Standing balance support: No upper extremity supported;During functional activity Standing balance-Leahy Scale: Fair                              ADL Overall ADL's : Needs assistance/impaired Eating/Feeding: Set up;Sitting   Grooming: Min guard;Wash/dry hands;Standing Grooming Details (indicate cue type and reason): Educated on use of cup for oral care. Upper Body Bathing: Supervision/ safety;Sitting   Lower Body Bathing: Min guard;With adaptive equipment;Sit to/from stand   Upper Body Dressing : Minimal assistance;Sitting Upper Body Dressing Details (indicate cue type and reason): to don/doff back brace. Assist for positioning correctly on back. Lower Body Dressing: Min guard;With adaptive equipment;Sit to/from stand Lower Body Dressing Details (indicate cue type and reason): Pt able to cross one foot over opposite knee. Also reports that she has AE at home that she already knows how to use from caring for her mother.  Toilet Transfer: Minimal assistance;Ambulation;BSC (BSC over toilet) Toilet Transfer Details (indicate cue type and reason): Min hand held assist provided for ambulation to bathroom. Hand held assist provided for balance with dynamic standing.  Toileting- Water quality scientist and Hygiene: Min guard;Sitting/lateral lean (for toilet hygiene) Toileting - Clothing Manipulation Details (indicate cue type and reason): Educated on use of toilet aide; pt reports she has one at home and remembers how to use from when she was caring for her mother.     Functional mobility during ADLs: Minimal assistance (hand held assist) General ADL Comments: No family present for OT eval. Educated pt on home safety, maintaining back precautions during ADLs/IADLs, keeping frequently used items at counter top height.  Vision     Perception     Praxis      Pertinent Vitals/Pain Pain Assessment: 0-10 Pain Score: 6  Faces Pain Scale: Hurts little more Pain  Location: back, incision with mobility Pain Descriptors / Indicators: Aching;Grimacing;Operative site guarding Pain Intervention(s): Limited activity within patient's tolerance;Monitored during session;Patient requesting pain meds-RN notified     Hand Dominance Right   Extremity/Trunk Assessment Upper Extremity Assessment Upper Extremity Assessment: Overall WFL for tasks assessed   Lower Extremity Assessment Lower Extremity Assessment: Defer to PT evaluation   Cervical / Trunk Assessment Cervical / Trunk Assessment: Other exceptions Cervical / Trunk Exceptions: Slightly kyphotic with forward head posture noted. Surgical incision noted - open to air.    Communication Communication Communication: No difficulties   Cognition Arousal/Alertness: Awake/alert Behavior During Therapy: WFL for tasks assessed/performed Overall Cognitive Status: Within Functional Limits for tasks assessed       Memory: Decreased recall of precautions             General Comments       Exercises       Shoulder Instructions      Home Living Family/patient expects to be discharged to:: Private residence Living Arrangements: Alone Available Help at Discharge: Family;Available 24 hours/day Type of Home: House Home Access: Ramped entrance     Home Layout: Two level Alternate Level Stairs-Number of Steps: 3 Alternate Level Stairs-Rails: None (stair lift chair) Bathroom Shower/Tub: Other (comment) (Walk in tub )   Bathroom Toilet: Handicapped height Bathroom Accessibility: Yes How Accessible: Accessible via walker Home Equipment: [place] - 2 wheels;Bedside commode;Toilet riser;Grab bars - tub/shower;Grab bars - toilet;Adaptive equipment;Hospital bed;Shower seat - built in Brewing technologist) Adaptive Equipment: Reacher;Sock aid;Long-handled shoe horn;Long-handled sponge;Other (Comment) (toilet aide) Additional Comments: 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       Prior Functioning/Environment Level of Independence: Independent             OT Diagnosis: Acute pain   OT Problem List: Decreased activity tolerance;Impaired balance (sitting and/or standing);Decreased knowledge of precautions;Pain   OT Treatment/Interventions: Self-care/ADL training;Energy conservation;Patient/family education;Balance training    OT Goals(Current goals can be found in the care plan section) Acute Rehab OT Goals Patient Stated Goal: Be able to care for herself at home OT Goal Formulation: With patient Time For Goal Achievement: 03/14/15 Potential to Achieve Goals: Good ADL Goals Pt Will Perform Grooming: with modified independence;standing Pt Will Perform Upper Body Bathing: with modified independence;sitting Pt Will Perform Lower Body Bathing: with modified independence;sit to/from stand (with or without AE) Pt Will Transfer to Toilet: with modified independence;ambulating;bedside commode (over toilet) Pt Will Perform Toileting - Clothing Manipulation and hygiene: with modified independence;with adaptive equipment;sit to/from stand Additional ADL Goal #1: Pt will independently don/doff back brace. Additional ADL Goal #2: Pt will independently maintain back precautions throughout ADL activity.  OT Frequency: Min 2X/week   Barriers to D/C:            Co-evaluation              End of Session Equipment Utilized During Treatment: Gait belt;Back brace Nurse Communication: Mobility status;Patient requests pain meds  Activity Tolerance: Patient tolerated treatment well Patient left: in bed;with call bell/phone within reach   Time: 1426-1447 OT Time Calculation (min): 21 min Charges:  OT General Charges $OT Visit: 1 Procedure OT Evaluation $OT Eval Moderate Complexity: 1 Procedure G-Codes:     Binnie Kand M.S.,  OTR/L Pager: [phone]  02/28/2015, 3:10 PM

## 2015-02-28 NOTE — Progress Notes (Signed)
Orthopedic Tech Progress Note Patient Details:  Angela Davidson 09-07-1940 [id number]  Patient ID: Angela Davidson, female   DOB: 03-Feb-1940, 75 y.o.   MRN: [id number] Called in bio-tech brace order; spoke with [place], Michaeal Davis 02/28/2015, 11:47 AM

## 2015-02-28 NOTE — Evaluation (Signed)
Physical Therapy Evaluation Patient Details Name: Angela Davidson MRN: [id number] DOB: 1940/07/23 Today's Date: 02/28/2015   History of Present Illness  Pt is a 75 y/o female who presents s/p L4-L5 PLIF on 02/27/15.  Clinical Impression  Pt admitted with above diagnosis. Pt currently with functional limitations due to the deficits listed below (see PT Problem List). At the time of PT eval pt's brace not delivered yet, and per RN mobility was limited to EOB activity. Focus of evaluation was on education and back precautions. Pt will benefit from skilled PT to increase their independence and safety with mobility to allow discharge to the venue listed below.       Follow Up Recommendations Outpatient PT;Supervision for mobility/OOB    Equipment Recommendations  None recommended by PT    Recommendations for Other Services       Precautions / Restrictions Precautions Precautions: Fall;Back Precaution Booklet Issued: Yes (comment) Precaution Comments: Reviewed handout and pt was cued for back precautions during functional mobility. Required Braces or Orthoses: Spinal Brace Spinal Brace: Lumbar corset;Applied in sitting position (Not delivered yet at time of PT eval) Restrictions Weight Bearing Restrictions: No      Mobility  Bed Mobility Overal bed mobility: Needs Assistance Bed Mobility: Rolling;Sidelying to Sit;Sit to Sidelying Rolling: Supervision Sidelying to sit: Supervision     Sit to sidelying: Supervision General bed mobility comments: HOB elevated (pt has an adjustable bed at home). Pt used rails to complete rolling and transition to/from sitting position without physical assistance. Close supervision and cues for proper logroll technique provided.   Transfers                 General transfer comment: Brace not delivered to pt yet, and per RN mobility was limited to EOB this session.   Ambulation/Gait                Stairs            Wheelchair  Mobility    Modified Rankin (Stroke Patients Only)       Balance Overall balance assessment: Needs assistance Sitting-balance support: Feet supported;No upper extremity supported Sitting balance-Leahy Scale: Fair                                       Pertinent Vitals/Pain Pain Assessment: Faces Faces Pain Scale: Hurts little more Pain Location: Incision Pain Descriptors / Indicators: Operative site guarding;Aching Pain Intervention(s): Limited activity within patient's tolerance;Monitored during session;Repositioned    Home Living Family/patient expects to be discharged to:: Private residence Living Arrangements: Alone Available Help at Discharge: Family;Available 24 hours/day Type of Home: House Home Access: Ramped entrance     Home Layout: Two level Home Equipment: Walker - 2 wheels;Bedside commode;Toilet riser;Grab bars - tub/shower;Grab bars - toilet;Adaptive equipment;Hospital bed;Shower seat - built in Brewing technologist) Additional Comments: 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     Prior Function Level of Independence: Independent               Hand Dominance   Dominant Hand: Right    Extremity/Trunk Assessment   Upper Extremity Assessment: Defer to OT evaluation           Lower Extremity Assessment: Generalized weakness      Cervical / Trunk Assessment: Other exceptions  Communication   Communication: No difficulties  Cognition Arousal/Alertness: Awake/alert  Behavior During Therapy: WFL for tasks assessed/performed Overall Cognitive Status: Within Functional Limits for tasks assessed                      General Comments      Exercises        Assessment/Plan    PT Assessment Patient needs continued PT services  PT Diagnosis Difficulty walking;Acute pain   PT Problem List Decreased strength;Decreased range of motion;Decreased activity tolerance;Decreased  balance;Decreased mobility;Decreased knowledge of use of DME;Decreased safety awareness;Decreased knowledge of precautions;Pain  PT Treatment Interventions DME instruction;Gait training;Stair training;Functional mobility training;Therapeutic activities;Therapeutic exercise;Neuromuscular re-education;Patient/family education   PT Goals (Current goals can be found in the Care Plan section) Acute Rehab PT Goals Patient Stated Goal: Be able to care for herself at home PT Goal Formulation: With patient Time For Goal Achievement: 03/07/15 Potential to Achieve Goals: Good    Frequency Min 5X/week   Barriers to discharge Decreased caregiver support Pt lives alone    Co-evaluation               End of Session   Activity Tolerance: Patient tolerated treatment well Patient left: in bed;with call bell/phone within reach Nurse Communication: Mobility status         Time: [id number] PT Time Calculation (min) (ACUTE ONLY): 24 min   Charges:   PT Evaluation $PT Eval Moderate Complexity: 1 Procedure PT Treatments $Therapeutic Activity: 8-22 mins   PT G Codes:        Rolinda Roan 03-03-2015, 11:32 AM  Rolinda Roan, PT, DPT Acute Rehabilitation Services Pager: [phone]

## 2015-02-28 NOTE — Progress Notes (Signed)
Called ortho tech for patient's brace.

## 2015-02-28 NOTE — Care Management Note (Signed)
Case Management Note  Patient Details  Name: Angela Davidson MRN: [id number] Date of Birth: 12/04/1940  Subjective/Objective:                    Action/Plan: Patient was admitted for a PLIF. Lives at home alone. Will follow for discharge needs pending PT/OT evals and physician orders.  Expected Discharge Date:                  Expected Discharge Plan:     In-House Referral:     Discharge planning Services     Post Acute Care Choice:    Choice offered to:     DME Arranged:    DME Agency:     HH Arranged:    HH Agency:     Status of Service:  In process, will continue to follow  Medicare Important Message Given:    Date Medicare IM Given:    Medicare IM give by:    Date Additional Medicare IM Given:    Additional Medicare Important Message give by:     If discussed at [place] of Stay Meetings, dates discussed:    Additional Comments:  Rolm Baptise, RN 02/28/2015, 11:36 AM [phone]

## 2015-03-01 NOTE — Progress Notes (Signed)
Hourly rounding performed. Call light within reach. Pt in no acute distress. Denies needs. Visitors at bedside

## 2015-03-01 NOTE — Progress Notes (Signed)
Occupational Therapy Treatment Patient Details Name: Angela Davidson MRN: [id number] DOB: 05-Oct-1940 Today's Date: 03/01/2015    History of present illness Pt is a 75 y/o female who presents s/p L4-L5 PLIF on 02/27/15.   OT comments  Pt making good progress toward OT goals. Pt currently close supervision overall for toilet transfers and grooming activities standing at the sink. Pt demonstrated ability to don/doff back brace sitting EOB with set up. Pt able to recall 3/3 back precautions at start of session and maintain throughout functional activities. D/c plan remains appropriate. Will continue to follow acutely.   Follow Up Recommendations  No OT follow up;Supervision - Intermittent    Equipment Recommendations  None recommended by OT    Recommendations for Other Services      Precautions / Restrictions Precautions Precautions: Fall;Back Precaution Booklet Issued: No Precaution Comments: Pt able to recall 3/3 back precautions. Required Braces or Orthoses: Spinal Brace Spinal Brace: Lumbar corset;Applied in sitting position Restrictions Weight Bearing Restrictions: No       Mobility Bed Mobility Overal bed mobility: Needs Assistance Bed Mobility: Rolling;Sidelying to Sit;Sit to Sidelying Rolling: Supervision Sidelying to sit: Supervision     Sit to sidelying: Supervision General bed mobility comments: Use of rail. Good technique.  Transfers Overall transfer level: Needs assistance Equipment used: Rolling walker (2 wheeled) Transfers: Sit to/from Stand Sit to Stand: Supervision         General transfer comment: Supervision for safety with sit to stand. From EOB x1, BSC x1. VCs for hand plaement, good technique.    Balance Overall balance assessment: Needs assistance Sitting-balance support: Feet supported;No upper extremity supported Sitting balance-Leahy Scale: Good     Standing balance support: No upper extremity supported;During functional activity Standing  balance-Leahy Scale: Fair Standing balance comment: Able to stand at sink and complete grooming activities with no UE support.                   ADL Overall ADL's : Needs assistance/impaired     Grooming: Supervision/safety;Standing;Oral care;Wash/dry hands;Wash/dry face Grooming Details (indicate cue type and reason): Close supervision for safety with grooming activities standing at the sink. Pt able to return demo use of cup for oral care.         Upper Body Dressing : Set up;Sitting Upper Body Dressing Details (indicate cue type and reason): Pt able to don/doff back brace sitting EOB with set up. Lower Body Dressing: Supervision/safety;Sit to/from stand Lower Body Dressing Details (indicate cue type and reason): Pt able to pull up socks prior to mobililty while maintaining back precautions. Toilet Transfer: Supervision/safety;Ambulation;BSC;RW (BSC over toilet)   Toileting- Clothing Manipulation and Hygiene: Supervision/safety;Sitting/lateral lean (for toilet hygiene) Toileting - Clothing Manipulation Details (indicate cue type and reason): Pt doing a good job of maintaining back precautions during functional activities; turned whole body to flush toilet-no twisting noted.     Functional mobility during ADLs: Supervision/safety;Rolling walker General ADL Comments: Family present but stepped out for OT session. Close supervision overall for functional mobility and ADLs. Pt demonstrating very good awareness and maintaining back precautions during functional activities.      Vision                     Perception     Praxis      Cognition   Behavior During Therapy: [facility] for tasks assessed/performed Overall Cognitive Status: Within Functional Limits for tasks assessed  Extremity/Trunk Assessment               Exercises     Shoulder Instructions       General Comments      Pertinent Vitals/ Pain       Pain Assessment:  Faces Faces Pain Scale: Hurts even more Pain Location: back Pain Descriptors / Indicators: Aching Pain Intervention(s): Limited activity within patient's tolerance;Monitored during session;Repositioned;Patient requesting pain meds-RN notified  Home Living                                          Prior Functioning/Environment              Frequency Min 2X/week     Progress Toward Goals  OT Goals(current goals can now be found in the care plan section)  Progress towards OT goals: Progressing toward goals  Acute Rehab OT Goals Patient Stated Goal: regain independence  Plan Discharge plan remains appropriate    Co-evaluation                 End of Session Equipment Utilized During Treatment: Gait belt;Rolling walker;Back brace   Activity Tolerance Patient tolerated treatment well   Patient Left in bed;with call bell/phone within reach   Nurse Communication Patient requests pain meds        Time: [id number] OT Time Calculation (min): 23 min  Charges: OT General Charges $OT Visit: 1 Procedure OT Treatments $Self Care/Home Management : 23-37 mins  Binnie Kand M.S., OTR/L Pager: [phone]  03/01/2015, 4:34 PM

## 2015-03-01 NOTE — Progress Notes (Signed)
Postop day 2. Overall patient doing reasonably well. Her back pain is controlled. She is not mobilized much however. She does not feel ready for discharge home.  Afebrile. Vitals are stable. Awake and alert. Oriented and appropriate. Motor and sensory function intact. Wound clean and dry. Chest and abdomen benign.  Progressing slowly following lumbar decompression and fusion. Plan discharge tomorrow possibly.

## 2015-03-01 NOTE — Progress Notes (Signed)
Physical Therapy Treatment Patient Details Name: Angela Davidson MRN: [id number] DOB: March 19, 1940 Today's Date: 03/01/2015    History of Present Illness Pt is a 75 y/o female who presents s/p L4-L5 PLIF on 02/27/15.    PT Comments    Pt mobilizing well this morning.  She will have good family support upon discharge home with her dau.  Patient may benefit from further skilled PT to improve mobility, reduce fall risk and incr functional independence.   Follow Up Recommendations  Outpatient PT;Supervision for mobility/OOB     Equipment Recommendations  None recommended by PT    Recommendations for Other Services       Precautions / Restrictions Precautions Precautions: Fall;Back Precaution Booklet Issued: Yes (comment) Precaution Comments: pt able to recall 3/3 precautions Required Braces or Orthoses: Spinal Brace Spinal Brace: Lumbar corset;Applied in sitting position Restrictions Weight Bearing Restrictions: No    Mobility  Bed Mobility Overal bed mobility: Needs Assistance Bed Mobility: Sidelying to Sit   Sidelying to sit: Supervision       General bed mobility comments: with use of rail, min cues for back precautions  Transfers Overall transfer level: Needs assistance Equipment used: Rolling walker (2 wheeled) Transfers: Sit to/from [employer] Transfers Sit to Stand: Supervision Stand pivot transfers: Supervision (from toilet)       General transfer comment: toilet transfer  Ambulation/Gait Ambulation/Gait assistance: Supervision Ambulation Distance (Feet): 250 Feet Assistive device: Rolling walker (2 wheeled) Gait Pattern/deviations: Decreased stride length;Trunk flexed     General Gait Details: good walker safety during turns in bathroom and hall   Stairs            Wheelchair Mobility    Modified Rankin (Stroke Patients Only)       Balance Overall balance assessment: Needs assistance Sitting-balance support: No upper extremity  supported;Feet supported Sitting balance-Leahy Scale: Good Sitting balance - Comments: able to perform perianal hygeine after toileting   Standing balance support: Bilateral upper extremity supported Standing balance-Leahy Scale: Fair                      Cognition Arousal/Alertness: Awake/alert Behavior During Therapy: WFL for tasks assessed/performed Overall Cognitive Status: Within Functional Limits for tasks assessed                      Exercises      General Comments        Pertinent Vitals/Pain Pain Assessment: 0-10 Pain Score: 4  Pain Location: HA Pain Descriptors / Indicators: Constant;Dull;Aching Pain Intervention(s): Limited activity within patient's tolerance;Monitored during session;Patient requesting pain meds-RN notified    Home Living                      Prior Function            PT Goals (current goals can now be found in the care plan section) Acute Rehab PT Goals Patient Stated Goal: regain independence PT Goal Formulation: With patient Time For Goal Achievement: 03/07/15 Potential to Achieve Goals: Good Progress towards PT goals: Progressing toward goals    Frequency  Min 5X/week    PT Plan Current plan remains appropriate    Co-evaluation             End of Session Equipment Utilized During Treatment: Gait belt;Back brace Activity Tolerance: Patient tolerated treatment well Patient left: in chair;with call bell/phone within reach;with nursing/sitter in room     Time: [id number] PT Time Calculation (  min) (ACUTE ONLY): 27 min  Charges:  $Gait Training: 8-22 mins $Therapeutic Activity: 8-22 mins                    G CodesMalka So, PT [phone]  [place] 03/01/2015, 10:23 AM

## 2015-03-02 MED ORDER — TRAMADOL HCL 50 MG PO TABS: 50.0000 mg | ORAL_TABLET | Freq: Four times a day (QID) | ORAL | Status: DC | PRN

## 2015-03-02 NOTE — Progress Notes (Signed)
Family now at bedside. Pt discharged home with family, by car, assessment stable, prescriptions given, all questions answered. Pt taken by wheelchair to exit.

## 2015-03-02 NOTE — Discharge Summary (Signed)
  Physician Discharge Summary  Patient ID: Angela Davidson MRN: [id number] DOB/AGE: 06/15/1940 57 y.o.  Admit date: 02/27/2015 Discharge date: 03/02/2015  Admission Diagnoses:  Discharge Diagnoses:  Active Problems:   Spondylolisthesis of lumbar region   Discharged Condition: good  Hospital Course: Patient admitted to the hospital where she underwent an uncomplicated decompression infusion of her lumbar spine. Postoperatively she is doing very well. Preoperative back and leg pain much improved. Standing and walking without difficulty. Ready for discharge home.  Consults:   Significant Diagnostic Studies:   Treatments:   Discharge Exam: Blood pressure 119/53, pulse 79, temperature 99.7 F (37.6 C), temperature source Oral, resp. rate 16, height 5\' 1"  (1.549 m), weight 60.601 kg (133 lb 9.6 oz), SpO2 96 %. Awake and alert. Oriented and appropriate. Cranial nerve function intact. Motor and sensory function extremities normal. Wound clean and dry. Chest and abdomen benign.  Disposition:      Medication List    TAKE these medications        alendronate 70 MG tablet  Commonly known as:  FOSAMAX  Take 70 mg by mouth once a week. Take with a full glass of water on an empty stomach.     BIOTIN MAXIMUM STRENGTH 10 MG Tabs  Generic drug:  Biotin  Take 1 tablet by mouth daily.     CALCIUM 600+D PO  Take 1 tablet by mouth daily.     escitalopram 5 MG tablet  Commonly known as:  LEXAPRO  Take 5 mg by mouth at bedtime.     LORazepam 1 MG tablet  Commonly known as:  ATIVAN  Take 1 mg by mouth at bedtime.     losartan 50 MG tablet  Commonly known as:  COZAAR  Take 50 mg by mouth daily.     traMADol 50 MG tablet  Commonly known as:  ULTRAM  Take 1 tablet (50 mg total) by mouth every 6 (six) hours as needed for moderate pain.     vitamin B-12 1000 MCG tablet  Commonly known as:  CYANOCOBALAMIN  Take 1,000 mcg by mouth daily.     Vitamin D3 5000 units Tabs  Take 1 tablet  by mouth daily.           Follow-up Information    Follow up with CABBELL,KYLE L, MD.   Specialty:  Neurosurgery   Contact information:   [address] [place] [address] [phone]       Signed: Charlie Pitter 03/02/2015, 1:13 PM

## 2015-03-02 NOTE — Care Management Note (Signed)
Case Management Note  Patient Details  Name: Angela Davidson MRN: [id number] Date of Birth: October 03, 1940  Subjective/Objective:                  PLIF  Action/Plan: Discharge planning  Expected Discharge Date:  03/03/15               Expected Discharge Plan:  Home/Self Care  In-House Referral:     Discharge planning Services  CM Consult  Post Acute Care Choice:    Choice offered to:     DME Arranged:    DME Agency:     HH Arranged:    HH Agency:     Status of Service:  Completed, signed off  Medicare Important Message Given:    Date Medicare IM Given:    Medicare IM give by:    Date Additional Medicare IM Given:    Additional Medicare Important Message give by:     If discussed at [place] of Stay Meetings, dates discussed:    Additional Comments: CM met with pt in room to discuss outp PT.  Pt declines as she states she will not be able to pay the 30.00 copay.  Pt states she feels comfortable with instruction with PT her and agrees to call the MD office to arrange outpt PT if she changes her mind.  NO other CM needs were communicated. Dellie Catholic, RN 03/02/2015, 12:02 PM

## 2015-03-02 NOTE — Progress Notes (Signed)
Physical Therapy Treatment Patient Details Name: Angela Davidson MRN: [id number] DOB: May 20, 1940 Today's Date: 03/02/2015    History of Present Illness Pt is a 75 y/o female who presents s/p L4-L5 PLIF on 02/27/15.    PT Comments    Noting good progress with increased activity and pt reporting less feeling of general malaise; Plenty of reliable help at home; OK for dc home from PT standpoint   Follow Up Recommendations  Outpatient PT;Supervision for mobility/OOB  The potential need for Outpatient PT can be addressed at Neurosurgery follow-up appointments.      Equipment Recommendations  None recommended by PT    Recommendations for Other Services       Precautions / Restrictions Precautions Precautions: Back Precaution Comments: Pt able to recall 3/3 back precautions. Required Braces or Orthoses: Spinal Brace Spinal Brace: Lumbar corset;Applied in sitting position    Mobility  Bed Mobility                  Transfers Overall transfer level: Needs assistance Equipment used: Rolling walker (2 wheeled) Transfers: Sit to/from Stand Sit to Stand: Supervision Stand pivot transfers: Supervision (from toilet)       General transfer comment: Supervision for safety with sit to stand. From EOB x1, BSC x1. VCs for hand plaement, good technique.  Ambulation/Gait Ambulation/Gait assistance: Supervision Ambulation Distance (Feet): 350 Feet Assistive device: Rolling walker (2 wheeled) Gait Pattern/deviations: Step-through pattern;Decreased stride length Gait velocity: slowed   General Gait Details: good walker safety during turns in bathroom and hall; cues to self-monitor for activity tolerance   Stairs            Wheelchair Mobility    Modified Rankin (Stroke Patients Only)       Balance     Sitting balance-Leahy Scale: Good       Standing balance-Leahy Scale: Fair                      Cognition Arousal/Alertness: Awake/alert Behavior During  Therapy: WFL for tasks assessed/performed Overall Cognitive Status: Within Functional Limits for tasks assessed                      Exercises      General Comments        Pertinent Vitals/Pain Pain Assessment: 0-10 Pain Score: 2  Pain Location: Back and slight headache Pain Descriptors / Indicators:  (reports minimal pain) Pain Intervention(s): Monitored during session    Home Living                      Prior Function            PT Goals (current goals can now be found in the care plan section) Acute Rehab PT Goals Patient Stated Goal: regain independence PT Goal Formulation: With patient Time For Goal Achievement: 03/07/15 Potential to Achieve Goals: Good Progress towards PT goals: Progressing toward goals    Frequency  Min 5X/week    PT Plan Current plan remains appropriate    Co-evaluation             End of Session Equipment Utilized During Treatment: Back brace Activity Tolerance: Patient tolerated treatment well Patient left: in chair;with call bell/phone within reach     Time: 0822-0843 PT Time Calculation (min) (ACUTE ONLY): 21 min  Charges:  $Gait Training: 23-37 mins  G Codes:      Roney Marion [facility] 03/02/2015, 8:49 AM  Roney Marion, PT  Acute Rehabilitation Services Pager [phone] Office [phone]

## 2015-03-02 NOTE — Discharge Instructions (Signed)
Spinal Fusion, Care After °These instructions give you information on caring for yourself after your procedure. Your doctor may also give you more specific instructions. Call your doctor if you have any problems or questions after your procedure. °HOME CARE °· Only take medicines as told by your doctor. °· Do not drive if you are taking certain pain medicines (narcotics). °· Change your bandage (dressing) as told by your doctor. °· Do not get the wound wet. Do not take baths or swim until your doctor says it is okay. °· Check the wound often for redness, puffiness (swelling), or leaking fluids. °· Ask your doctor what activities you should avoid and for how long. °· Walk as much as you can.  Do not sit for long periods of time. Change positions every hour. °· Do not lift anything heavier than 5 to 10 pounds (2.25 to 4.5 kilograms) until your doctor says it is safe. °· Do not twist or bend for 6 weeks. Try not to pull on things. Do not hold things away from your body. °· Ask your doctor what exercises you should do. Exercises can help make the back stronger. °GET HELP RIGHT AWAY IF: °· Your pain suddenly gets worse. °· The wound is red, puffy, bleeding, or leaking fluid. °· Your legs or feet are painful, puffy, weak, or lose feeling (numb). °· You cannot control when you pee (urinate) or poop (bowel movement). °· You have trouble breathing. °· You have chest pain. °· You have a temperature by mouth above 102° F (38.9° C), not controlled by medicine. °MAKE SURE YOU: °· Understand these instructions. °· Will watch your condition. °· Will get help right away if you are not doing well or get worse. °  °This information is not intended to replace advice given to you by your health care provider. Make sure you discuss any questions you have with your health care provider. °  °Document Released: 04/30/2010 Document Revised: 01/25/2014 Document Reviewed: 06/19/2014 °Elsevier Interactive Patient Education ©2016 [employer]  Inc. ° °

## 2015-04-29 ENCOUNTER — Other Ambulatory Visit: Payer: Self-pay | Admitting: Neurosurgery

## 2015-04-29 DIAGNOSIS — M4316 Spondylolisthesis, lumbar region: Secondary | ICD-10-CM

## 2015-05-06 ENCOUNTER — Ambulatory Visit
Admission: RE | Admit: 2015-05-06 | Discharge: 2015-05-06 | Disposition: A | Discharge status: Home / Self Care | Payer: Medicare Other | Source: Ambulatory Visit | Attending: Neurosurgery | Admitting: Neurosurgery

## 2015-05-06 DIAGNOSIS — M4316 Spondylolisthesis, lumbar region: Secondary | ICD-10-CM

## 2015-07-09 DIAGNOSIS — E559 Vitamin D deficiency, unspecified: Secondary | ICD-10-CM | POA: Insufficient documentation

## 2015-07-09 DIAGNOSIS — M810 Age-related osteoporosis without current pathological fracture: Secondary | ICD-10-CM | POA: Insufficient documentation

## 2015-07-09 DIAGNOSIS — N368 Other specified disorders of urethra: Secondary | ICD-10-CM | POA: Insufficient documentation

## 2015-07-09 DIAGNOSIS — F1020 Alcohol dependence, uncomplicated: Secondary | ICD-10-CM | POA: Insufficient documentation

## 2015-07-09 DIAGNOSIS — E538 Deficiency of other specified B group vitamins: Secondary | ICD-10-CM | POA: Insufficient documentation

## 2015-07-09 DIAGNOSIS — F419 Anxiety disorder, unspecified: Secondary | ICD-10-CM | POA: Insufficient documentation

## 2015-07-09 DIAGNOSIS — G4700 Insomnia, unspecified: Secondary | ICD-10-CM | POA: Insufficient documentation

## 2015-07-09 DIAGNOSIS — E782 Mixed hyperlipidemia: Secondary | ICD-10-CM | POA: Insufficient documentation

## 2015-07-09 DIAGNOSIS — Z79899 Other long term (current) drug therapy: Secondary | ICD-10-CM | POA: Insufficient documentation

## 2015-07-09 DIAGNOSIS — L659 Nonscarring hair loss, unspecified: Secondary | ICD-10-CM | POA: Insufficient documentation

## 2015-07-09 DIAGNOSIS — M159 Polyosteoarthritis, unspecified: Secondary | ICD-10-CM | POA: Insufficient documentation

## 2016-07-19 DIAGNOSIS — R5381 Other malaise: Secondary | ICD-10-CM | POA: Insufficient documentation

## 2016-07-19 DIAGNOSIS — F330 Major depressive disorder, recurrent, mild: Secondary | ICD-10-CM | POA: Insufficient documentation

## 2016-07-19 DIAGNOSIS — R5383 Other fatigue: Secondary | ICD-10-CM

## 2016-08-29 IMAGING — RF DG MYELOGRAPHY LUMBAR INJ LUMBOSACRAL
13 of 14 series · 13 of 14 positions shown · IV contrast (omnipaque)
Comparison: 08/29/2010 and earlier studies

CLINICAL DATA: Low back and left lower extremity pain.

EXAM:
LUMBAR MYELOGRAM
CT LUMBAR SPINE WITH INTRATHECAL CONTRAST
FLUOROSCOPY TIME:  47 seconds
TECHNIQUE: The procedure, risks (including but not limited to bleeding,
infection, organ damage ), benefits, and alternatives were explained
to the patient. Questions regarding the procedure were encouraged
and answered. The patient understands and consents to the procedure.
An appropriate entry site was determined under fluoroscopy. Operator
donned sterile gloves and mask. Skin site was marked, prepped with
Betadine, and draped in usual sterile fashion, and infiltrated
locally with 1% lidocaine. A 22 gauge spinal needle was advanced
into the thecal sac at L3 from a left parasagittal approach. Clear
colorless CSF returned. 17 ml Omnipaque 180 were administered
intrathecally for lumbar myelography, followed by axial CT scanning
of the lumbar spine. I personally performed the lumbar puncture and
administered the intrathecal contrast. I also personally supervised
acquisition of the myelogram images. Coronal and sagittal
reconstructions were generated from the axial scan.

[Series 1: (hospital) · 1 of 1 slices shown]
[im 1/1]
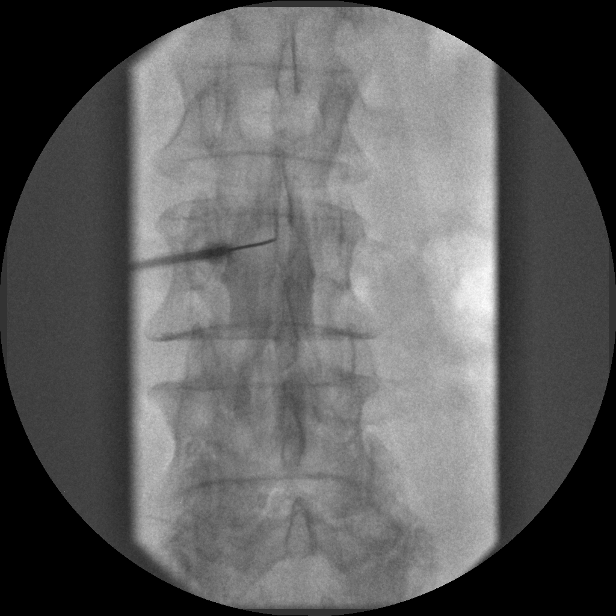

[Series 2: myelogram  white · 1 of 1 slices shown (1 of 12)]
[im 1/1]
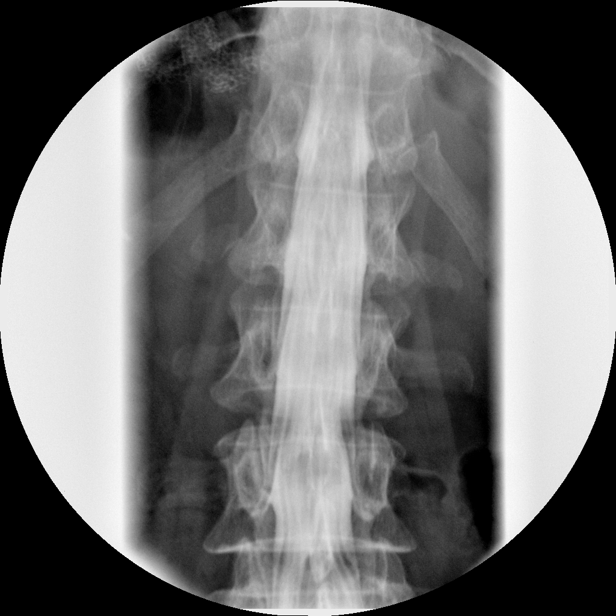

[Series 3: myelogram  white · 1 of 1 slices shown (2 of 12)]
[im 1/1]
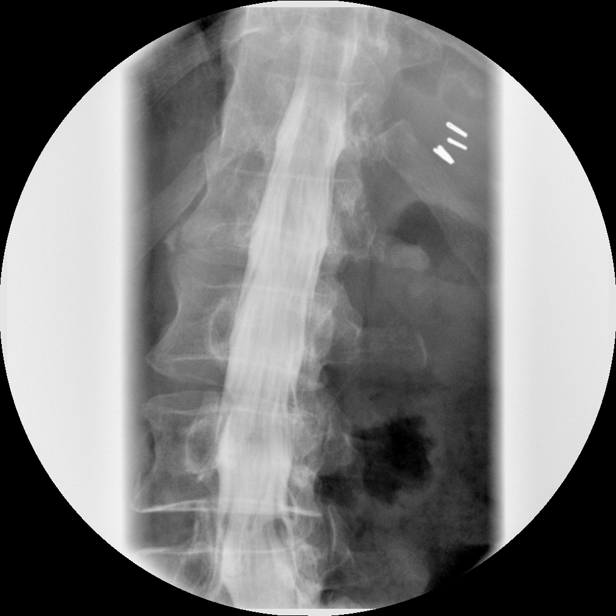

[Series 4: myelogram  white · 1 of 1 slices shown (3 of 12)]
[im 1/1]
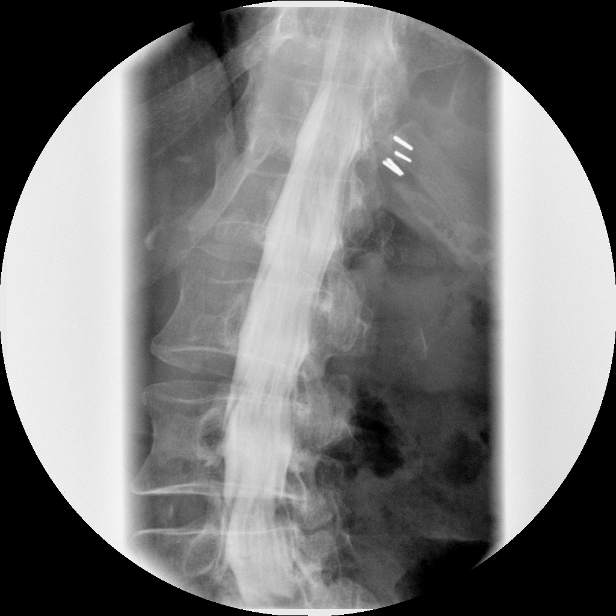

[Series 5: myelogram  white · 1 of 1 slices shown (4 of 12)]
[im 1/1]
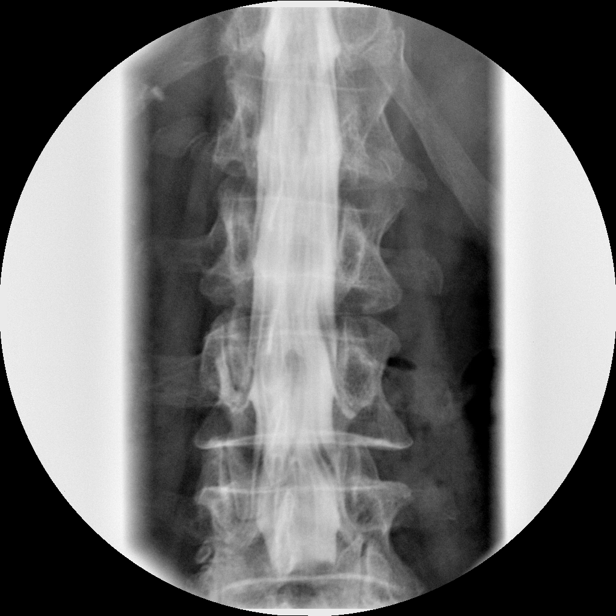

[Series 6: myelogram  white · 1 of 1 slices shown (5 of 12)]
[im 1/1]
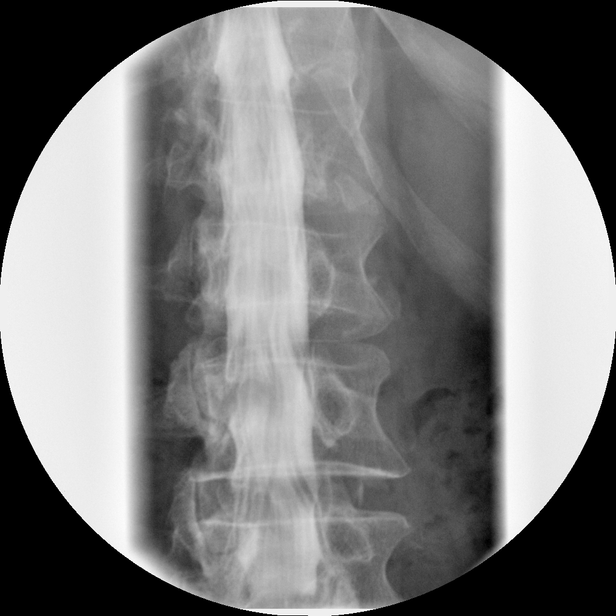

[Series 8: myelogram  white · 1 of 1 slices shown (6 of 12)]
[im 1/1]
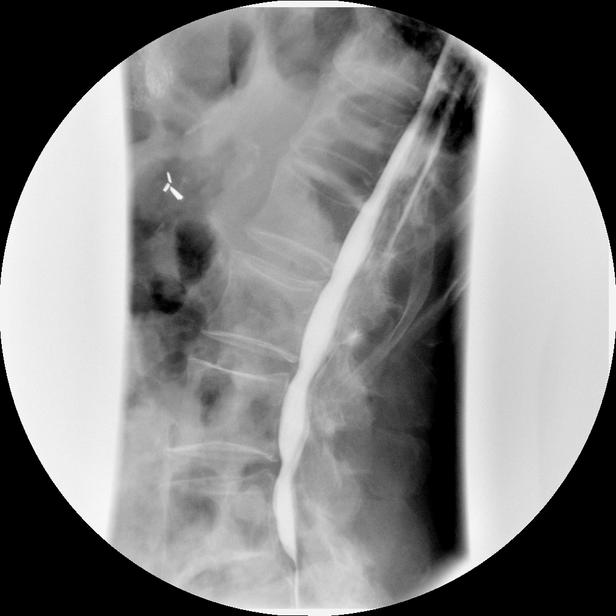

[Series 9: myelogram  white · 1 of 1 slices shown (7 of 12)]
[im 1/1]
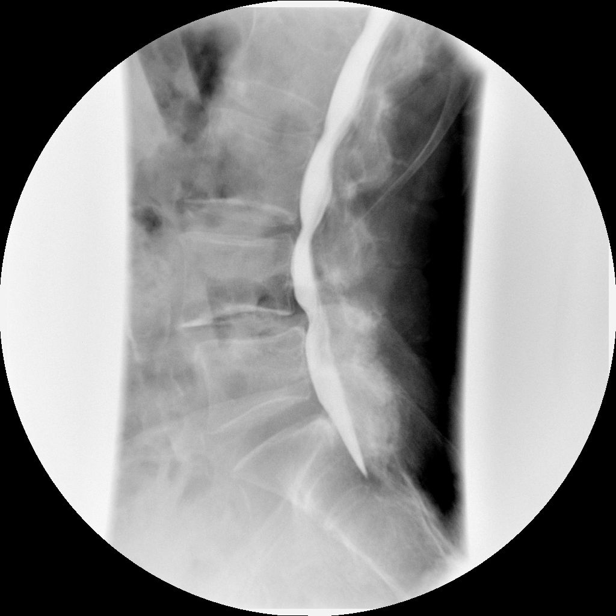

[Series 10: myelogram  white · 1 of 1 slices shown (8 of 12)]
[im 1/1]
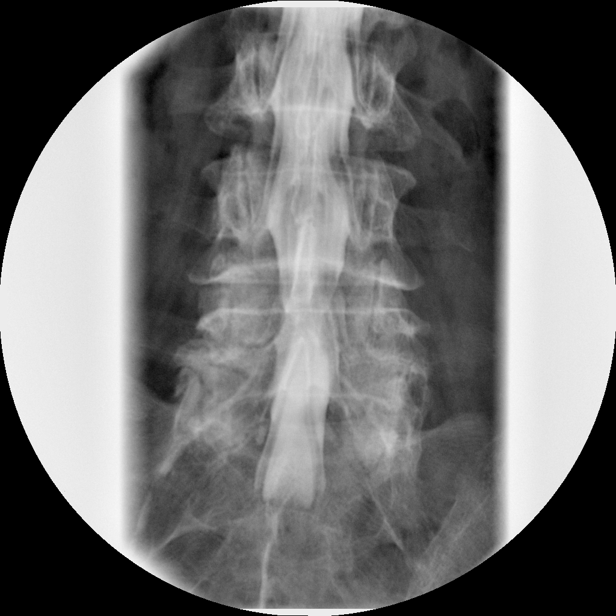

[Series 11: myelogram  white · 1 of 1 slices shown (9 of 12)]
[im 1/1]
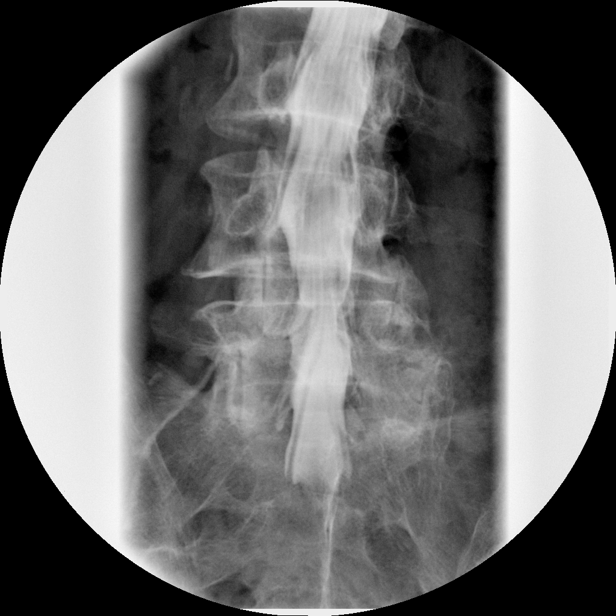

[Series 12: myelogram  white · 1 of 1 slices shown (10 of 12)]
[im 1/1]
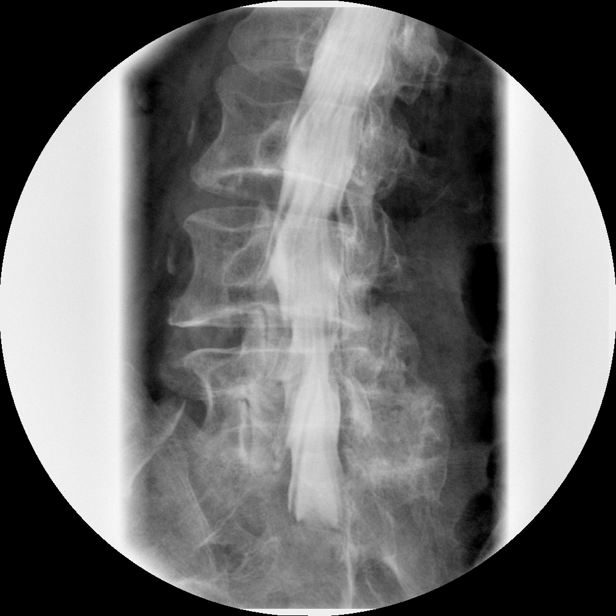

[Series 13: myelogram  white · 1 of 1 slices shown (11 of 12)]
[im 1/1]
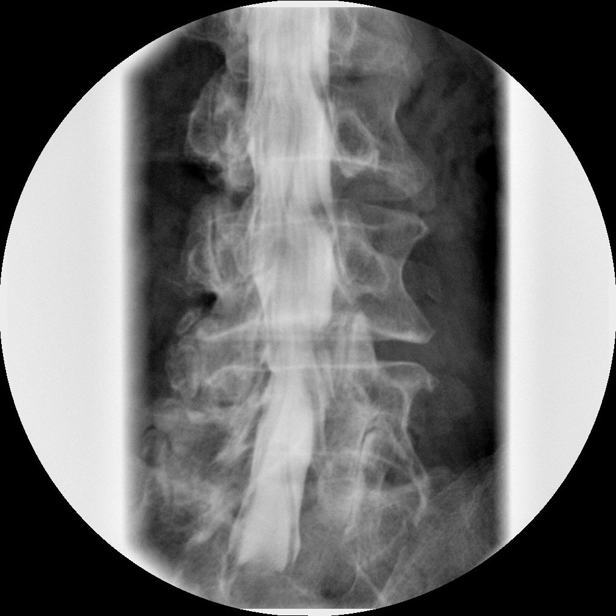

[Series 14: myelogram  white · 1 of 1 slices shown (12 of 12)]
[im 1/1]
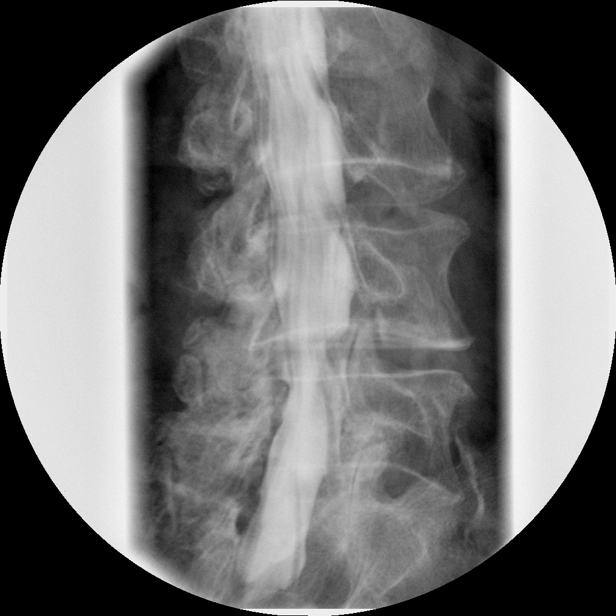

[13 of 14 positions shown; findings below may reference images not displayed]

FINDINGS: Five non rib-bearing lumbar segments assigned L1-L5.

T12-L1:  Unremarkable.  Conus terminates at L1.

L1-2:  Unremarkable

L2-3: Minimal disc bulge. No spinal or foraminal stenosis. Early
left facet degenerative spurring.

L3-4: Mild circumferential disc bulge. Asymmetric facet DJD left
greater than right with some thickening of ligamentum flavum
contributor early subarticular recess narrowing, mild spinal
stenosis, and early encroachment upon bilateral neural foramina.

L4-5: Advanced facet DJD bilaterally with spurs anteromedial spurs
encroach upon the subarticular recess and bilateral neural foramina.
5 mm grade 1 anterolisthesis on flexion, reduced in extension. No
pars defect. Vacuum phenomenon in the interspace. Mild
circumferential disc bulge contributing to mild spinal stenosis.

L5-S1: Advanced bilateral facet DJD, and encroaching upon neural
foramina are right greater than left. No spinal stenosis..
Interspace unremarkable.

Moderate scattered aortoiliac arterial calcifications. Surgical
clips the gallbladder fossa. Anastomotic staple line near the
stomach, incompletely visualized.
IMPRESSION: 1. Minimal disc bulge L2-3 without compressive pathology.
2. Mild multifactorial spinal stenosis L3-4 with early foraminal
encroachment.
3. Mild multifactorial spinal stenosis L4-5. Advanced facet DJD at
this level results in grade 1 anterolisthesis with a dynamic
component on flexion/extension, as well as foraminal and
subarticular recess narrowing bilaterally.
4. Advanced facet DJD L5-S1 with foraminal encroachment right
greater than left.

## 2017-02-15 IMAGING — RF DG LUMBAR SPINE 2-3V
1 series · 2 of 2 positions shown · non-contrast
Comparison: None.

CLINICAL DATA: Posterior lumbar fusion of L4-5.

EXAM:
LUMBAR SPINE - 2-3 VIEW; DG C-ARM 61-120 MIN fluoroscopic time 1
minutes and 23 seconds.

[Series 1: run · 2 of 2 slices shown]
[im 1/2]
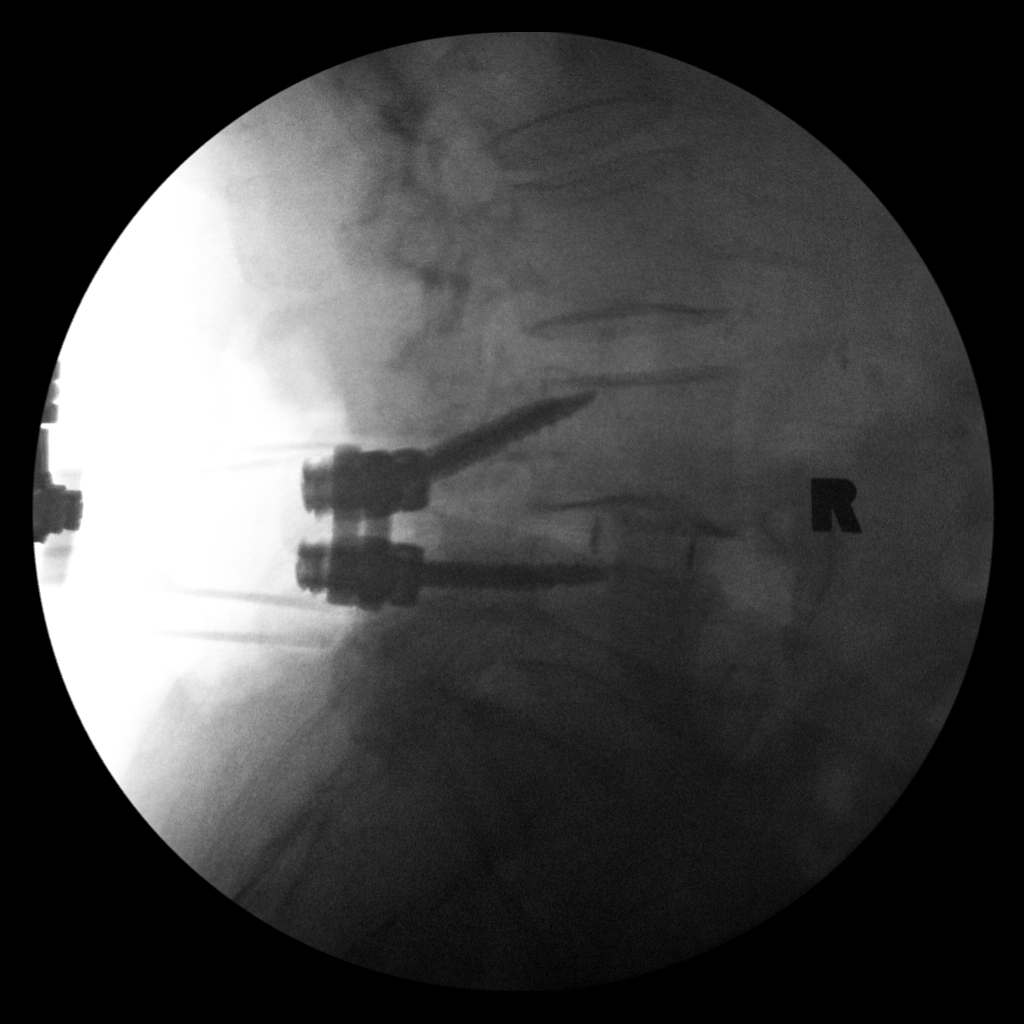
[im 2/2]
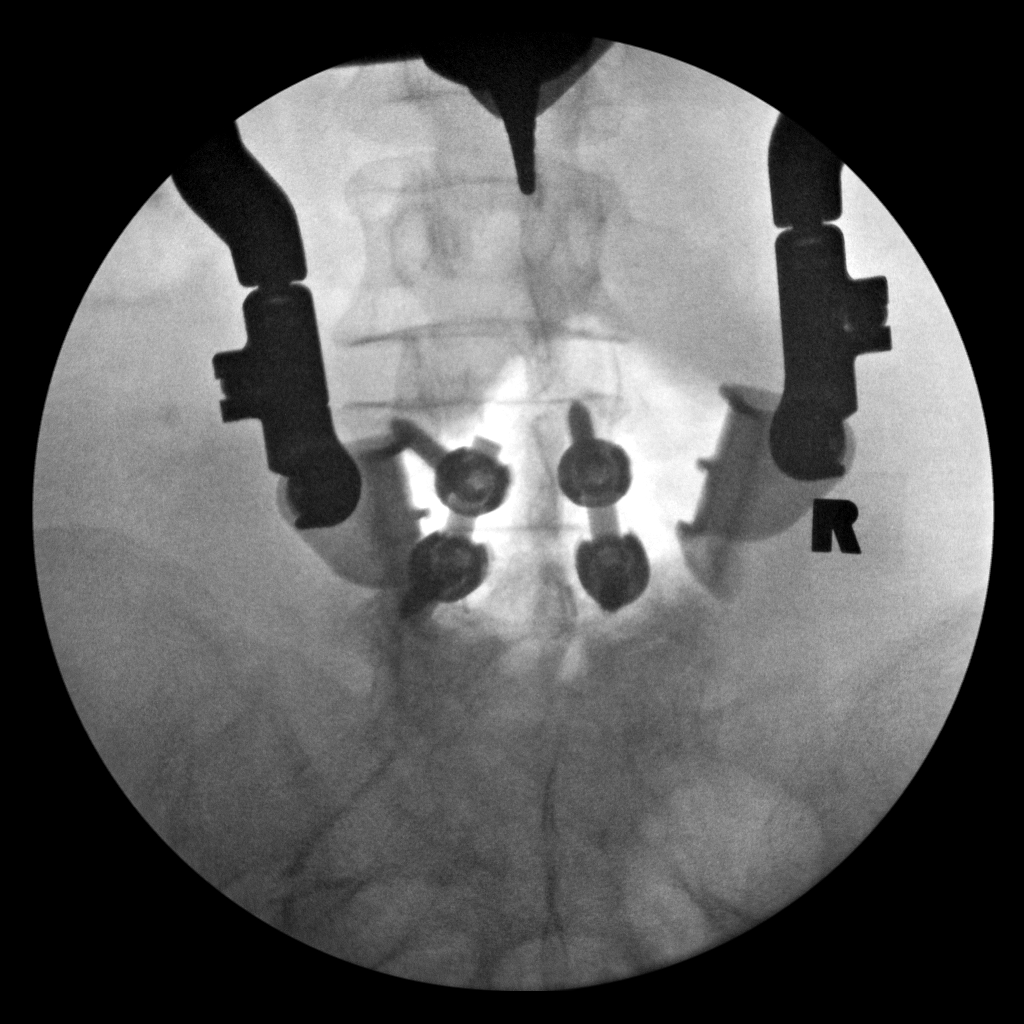

[2 of 2 positions shown; findings below may reference images not displayed]

FINDINGS: The images demonstrate posterior lumbar interbody fusion of L4-5
without malalignment.
IMPRESSION: Fluoroscopic images demonstrate posterior lumbar fusion of L4-5
without malalignment.

## 2017-03-12 DIAGNOSIS — R7303 Prediabetes: Secondary | ICD-10-CM | POA: Insufficient documentation

## 2018-02-06 ENCOUNTER — Other Ambulatory Visit: Payer: Self-pay

## 2018-02-06 ENCOUNTER — Ambulatory Visit: Payer: Medicare Other | Admitting: Podiatry

## 2018-02-06 ENCOUNTER — Encounter: Payer: Self-pay | Admitting: Podiatry

## 2018-02-06 VITALS — BP 131/79 | HR 63 | Ht 60.0 in | Wt 136.0 lb

## 2018-02-06 DIAGNOSIS — Q828 Other specified congenital malformations of skin: Secondary | ICD-10-CM | POA: Diagnosis not present

## 2018-02-06 DIAGNOSIS — M21962 Unspecified acquired deformity of left lower leg: Secondary | ICD-10-CM | POA: Diagnosis not present

## 2018-02-06 DIAGNOSIS — R29898 Other symptoms and signs involving the musculoskeletal system: Secondary | ICD-10-CM

## 2018-02-06 DIAGNOSIS — M7752 Other enthesopathy of left foot: Secondary | ICD-10-CM

## 2018-02-06 DIAGNOSIS — L909 Atrophic disorder of skin, unspecified: Secondary | ICD-10-CM

## 2018-02-06 DIAGNOSIS — M25572 Pain in left ankle and joints of left foot: Secondary | ICD-10-CM

## 2018-02-06 NOTE — Progress Notes (Signed)
  Subjective:  Patient ID: Angela Davidson, female    DOB: 1940/05/03,  MRN: [id number]  Chief Complaint  Patient presents with  . Skin Problem    L submet 3 lesion x 2 mo; 5/10 stabbing pain - no injury Tx: none Pt. states," feel slike something inside my foot."     78 y.o. female presents with the above complaint.   Review of Systems: Negative except as noted in the HPI. Denies N/V/F/Ch.  Past Medical History:  Diagnosis Date  . Anemia   . Depression   . 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 Hypertension     Current Outpatient Medications:  .  Biotin (BIOTIN MAXIMUM STRENGTH) 10 MG TABS, Take 1 tablet by mouth daily., Disp: , Rfl:  .  buPROPion (WELLBUTRIN) 100 MG tablet, , Disp: , Rfl:  .  Calcium Carbonate-Vitamin D (CALCIUM 600+D PO), Take 1 tablet by mouth daily., Disp: , Rfl:  .  Cholecalciferol (VITAMIN D3) 5000 units TABS, Take 1 tablet by mouth daily., Disp: , Rfl:  .  escitalopram (LEXAPRO) 5 MG tablet, Take 5 mg by mouth at bedtime., Disp: , Rfl:  .  LORazepam (ATIVAN) 1 MG tablet, Take 1 mg by mouth at bedtime., Disp: , Rfl:  .  vitamin B-12 (CYANOCOBALAMIN) 1000 MCG tablet, Take 1,000 mcg by mouth daily., Disp: , Rfl:   Social History   Tobacco Use  Smoking Status Former Smoker  . Types: Cigarettes  . Last attempt to quit: 02/19/1988  . Years since quitting: 29.9  Smokeless Tobacco Never Used    Allergies  Allergen Reactions  . Morphine And Related Nausea Only    Headaches  . Codeine Nausea Only  . Hydrocodone Nausea Only   Objective:   Vitals:   02/06/18 0854  BP: 131/79  Pulse: 63   Body mass index is 26.56 kg/m. Constitutional Well developed. Well nourished.  Vascular Dorsalis pedis pulses palpable bilaterally. Posterior tibial pulses palpable bilaterally. Capillary refill normal to all digits.  No cyanosis or clubbing noted. Pedal hair growth normal.  Neurologic Normal speech. Oriented to person, place,  and time. Epicritic sensation to light touch grossly present bilaterally.  Dermatologic Nails normal Skin punctate keratosis submet 3  Orthopedic: Normal joint ROM without pain or crepitus bilaterally. No visible deformities. POP 3rd MPJ Fat pad atrophy left foot   Radiographs: None Assessment:   1. Capsulitis of metatarsophalangeal (MTP) joint of left foot   2. Pain in joint of left foot   3. Porokeratosis   4. Metatarsal deformity, left   5. Fat pad atrophy of foot    Plan:  Patient was evaluated and treated and all questions answered.  3rd MPJ Capsulitis with Porokeratosis -Lesion debrided as below -Injection delivered due to pain in the joint. -Likely metatarsal deformity. Will get XR next visit  Procedure: Joint Injection Location: Left 3rd MPJ joint Skin Prep: Alcohol. Injectate: 0.5 cc 1% lidocaine plain, 0.5 cc dexamethasone phosphate. Disposition: Patient tolerated procedure well. Injection site dressed with a band-aid.  Procedure: Paring of Lesion Rationale: painful hyperkeratotic lesion Type of Debridement: manual, sharp debridement. Instrumentation: 312 blade Number of Lesions: 1  Return in about 3 weeks (around 02/27/2018) for XRs, Capsulitis, Left.

## 2018-02-06 NOTE — Progress Notes (Signed)
   Subjective:    Patient ID: Angela Davidson, female    DOB: 1940-03-07, 78 y.o.   MRN: [id number]  HPI    Review of Systems  All other systems reviewed and are negative.      Objective:   Physical Exam        Assessment & Plan:

## 2018-02-27 ENCOUNTER — Ambulatory Visit (INDEPENDENT_AMBULATORY_CARE_PROVIDER_SITE_OTHER): Payer: Medicare Other

## 2018-02-27 ENCOUNTER — Ambulatory Visit: Payer: Medicare Other | Admitting: Podiatry

## 2018-02-27 DIAGNOSIS — Q828 Other specified congenital malformations of skin: Secondary | ICD-10-CM

## 2018-02-27 DIAGNOSIS — M21962 Unspecified acquired deformity of left lower leg: Secondary | ICD-10-CM | POA: Diagnosis not present

## 2018-02-27 DIAGNOSIS — M25572 Pain in left ankle and joints of left foot: Secondary | ICD-10-CM | POA: Diagnosis not present

## 2018-02-27 DIAGNOSIS — M7752 Other enthesopathy of left foot: Secondary | ICD-10-CM | POA: Diagnosis not present

## 2018-02-27 DIAGNOSIS — M2042 Other hammer toe(s) (acquired), left foot: Secondary | ICD-10-CM

## 2018-02-27 DIAGNOSIS — L989 Disorder of the skin and subcutaneous tissue, unspecified: Secondary | ICD-10-CM

## 2018-02-27 NOTE — Patient Instructions (Signed)
Pre-Operative Instructions  Congratulations, you have decided to take an important step towards improving your quality of life.  You can be assured that the doctors and staff at [facility] will be with you every step of the way.  Here are some important things you should know:  1. Plan to be at the surgery center/hospital at least 1 (one) hour prior to your scheduled time, unless otherwise directed by the surgical center/hospital staff.  You must have a responsible adult accompany you, remain during the surgery and drive you home.  Make sure you have directions to the surgical center/hospital to ensure you arrive on time. 2. If you are having surgery at [facility] or [facility] hospitals, you will need a copy of your medical history and physical form from your family physician within one month prior to the date of surgery. We will give you a form for your primary physician to complete.  3. We make every effort to accommodate the date you request for surgery.  However, there are times where surgery dates or times have to be moved.  We will contact you as soon as possible if a change in schedule is required.   4. No aspirin/ibuprofen for one week before surgery.  If you are on aspirin, any non-steroidal anti-inflammatory medications (Mobic, Aleve, Ibuprofen) should not be taken seven (7) days prior to your surgery.  You make take Tylenol for pain prior to surgery.  5. Medications - If you are taking daily heart and blood pressure medications, seizure, reflux, allergy, asthma, anxiety, pain or diabetes medications, make sure you notify the surgery center/hospital before the day of surgery so they can tell you which medications you should take or avoid the day of surgery. 6. No food or drink after midnight the night before surgery unless directed otherwise by surgical center/hospital staff. 7. No alcoholic beverages 24-hours prior to surgery.  No smoking 24-hours prior or 24-hours after  surgery. 8. Wear loose pants or shorts. They should be loose enough to fit over bandages, boots, and casts. 9. Don't wear slip-on shoes. Sneakers are preferred. 10. Bring your boot with you to the surgery center/hospital.  Also bring crutches or a walker if your physician has prescribed it for you.  If you do not have this equipment, it will be provided for you after surgery. 11. If you have not been contacted by the surgery center/hospital by the day before your surgery, call to confirm the date and time of your surgery. 12. Leave-time from work may vary depending on the type of surgery you have.  Appropriate arrangements should be made prior to surgery with your employer. 13. Prescriptions will be provided immediately following surgery by your doctor.  Fill these as soon as possible after surgery and take the medication as directed. Pain medications will not be refilled on weekends and must be approved by the doctor. 14. Remove nail polish on the operative foot and avoid getting pedicures prior to surgery. 15. Wash the night before surgery.  The night before surgery wash the foot and leg well with water and the antibacterial soap provided. Be sure to pay special attention to beneath the toenails and in between the toes.  Wash for at least three (3) minutes. Rinse thoroughly with water and dry well with a towel.  Perform this wash unless told not to do so by your physician.  Enclosed: 1 Ice pack (please put in freezer the night before surgery)   1 Hibiclens skin cleaner     Pre-op instructions  If you have any questions regarding the instructions, please do not hesitate to call our office.  [place]: [address], [place], [place] [address] -- [phone]  [place]: [address] [place], [place] [address] -- [phone]  [place]: [address]  [place], [place] [address] -- [phone]  [place]: [address], [place], [place] [address] -- [phone]  Website:  [email or web address] 

## 2018-02-28 ENCOUNTER — Telehealth: Payer: Self-pay | Admitting: *Deleted

## 2018-02-28 NOTE — Telephone Encounter (Signed)
"  I saw Dr. March Rummage yesterday in [place].  He wants me to set up an appointment for me to have surgery on March 4.  I was told to call you and coordinate this with the clinic.  I'd appreciate a call back."

## 2018-03-01 NOTE — Progress Notes (Signed)
  Subjective:  Patient ID: Angela Davidson, female    DOB: Jul 27, 1940,  MRN: [id number]  Chief Complaint  Patient presents with  . capsulitis    F/U L 3rd toe capsulitis Pt. states," shot lasted for 3 wks, but pain started back again 2-3 days ago; 2/10 burning." tx: none    78 y.o. female presents with the above complaint.   Review of Systems: Negative except as noted in the HPI. Denies N/V/F/Ch.  Past Medical History:  Diagnosis Date  . Anemia   . Depression   . 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 Hypertension     Current Outpatient Medications:  .  Biotin (BIOTIN MAXIMUM STRENGTH) 10 MG TABS, Take 1 tablet by mouth daily., Disp: , Rfl:  .  buPROPion (WELLBUTRIN) 100 MG tablet, , Disp: , Rfl:  .  Calcium Carbonate-Vitamin D (CALCIUM 600+D PO), Take 1 tablet by mouth daily., Disp: , Rfl:  .  Cholecalciferol (VITAMIN D3) 5000 units TABS, Take 1 tablet by mouth daily., Disp: , Rfl:  .  escitalopram (LEXAPRO) 5 MG tablet, Take 5 mg by mouth at bedtime., Disp: , Rfl:  .  LORazepam (ATIVAN) 1 MG tablet, Take 1 mg by mouth at bedtime., Disp: , Rfl:  .  vitamin B-12 (CYANOCOBALAMIN) 1000 MCG tablet, Take 1,000 mcg by mouth daily., Disp: , Rfl:   Social History   Tobacco Use  Smoking Status Former Smoker  . Types: Cigarettes  . Last attempt to quit: 02/19/1988  . Years since quitting: 30.0  Smokeless Tobacco Never Used    Allergies  Allergen Reactions  . Morphine And Related Nausea Only    Headaches  . Codeine Nausea Only  . Hydrocodone Nausea Only   Objective:   There were no vitals filed for this visit. There is no height or weight on file to calculate BMI. Constitutional Well developed. Well nourished.  Vascular Dorsalis pedis pulses palpable bilaterally. Posterior tibial pulses palpable bilaterally. Capillary refill normal to all digits.  No cyanosis or clubbing noted. Pedal hair growth normal.  Neurologic Normal speech. Oriented  to person, place, and time. Epicritic sensation to light touch grossly present bilaterally.  Dermatologic Nails normal Skin punctate keratosis submet 3  Orthopedic: Normal joint ROM without pain or crepitus bilaterally. No visible deformities. POP 3rd MPJ Fat pad atrophy left foot   Radiographs: None Assessment:   1. Capsulitis of metatarsophalangeal (MTP) joint of left foot   2. Pain in joint of left foot   3. Porokeratosis   4. Benign skin lesion    Plan:  Patient was evaluated and treated and all questions answered.  3rd MPJ Capsulitis with Porokeratosis -Injection #2 delivered due to pain in the joint. -Discussed with patient continue conservative therapy versus surgical intervention.  Patient would like to discuss her treatment regimen. -Discussed surgical plan of shortening of the third metatarsal bone with excision of the lesion upon her foot. -Patient has failed all conservative therapy and wishes to proceed with surgical intervention. All risks, benefits, and alternatives discussed with patient. No guarantees given. Consent reviewed and signed by patient. -Planned procedures: Left third metatarsal shortening osteotomy, excision of benign skin lesion -We will need PCP clearance prior to procedure    No follow-ups on file.

## 2018-03-03 NOTE — Telephone Encounter (Signed)
We have you scheduled for March 4 for your surgery.  Someone from the surgical center will give you a call a day or two prior to your surgery date and they will give you your arrival time.  You need to go online and register with the surgical center if you have access to a computer.  "I've already done that, I actually registered at the [facility] location as well. I'm double covered."

## 2018-03-09 ENCOUNTER — Encounter: Payer: Self-pay | Admitting: *Deleted

## 2018-03-09 NOTE — Progress Notes (Signed)
Per Dr. March Rummage, I sent a surgical medical clearance request letter to Dr. Gilford Rile.

## 2018-03-22 ENCOUNTER — Other Ambulatory Visit: Payer: Self-pay | Admitting: Podiatry

## 2018-03-22 DIAGNOSIS — M7752 Other enthesopathy of left foot: Secondary | ICD-10-CM | POA: Diagnosis not present

## 2018-03-22 DIAGNOSIS — D2372 Other benign neoplasm of skin of left lower limb, including hip: Secondary | ICD-10-CM | POA: Diagnosis not present

## 2018-03-22 DIAGNOSIS — M21962 Unspecified acquired deformity of left lower leg: Secondary | ICD-10-CM | POA: Diagnosis not present

## 2018-03-22 MED ORDER — ONDANSETRON HCL 4 MG PO TABS: 4.0000 mg | ORAL_TABLET | Freq: Three times a day (TID) | ORAL | 0 refills | Status: DC | PRN

## 2018-03-22 MED ORDER — OXYCODONE-ACETAMINOPHEN 10-325 MG PO TABS: 1.0000 | ORAL_TABLET | ORAL | 0 refills | Status: DC | PRN

## 2018-03-22 MED ORDER — CEPHALEXIN 500 MG PO CAPS: 500.0000 mg | ORAL_CAPSULE | Freq: Two times a day (BID) | ORAL | 0 refills | Status: AC

## 2018-03-22 NOTE — Progress Notes (Signed)
Patient underwent outpatient surgery at [facility].  DOS: 03/22/18  Procedure: Left 3rd Metatarsal Weil Osteotomy, Excision of Benign Lesion

## 2018-03-22 NOTE — Progress Notes (Signed)
Rx sent to pharmacy for outpatient surgery. °

## 2018-03-23 ENCOUNTER — Telehealth: Payer: Self-pay

## 2018-03-23 NOTE — Telephone Encounter (Signed)
Called pt post-surgery; pt stated, "I'm doing pretty good, with little burning. Had to take pain medication yesterday (Oxy) but it gave me a headache so I took some tylenol this morning. Other than that, Im doing okay." Instructed pt to continue to elevate and stay off foot as much as she can and continue to take tylenol.

## 2018-03-24 ENCOUNTER — Telehealth: Payer: Self-pay

## 2018-03-24 NOTE — Telephone Encounter (Signed)
POST OP CALL-    1) General condition stated by the patient: Great  2) Is the pt having pain? A little  3) Pain score: 3/10  4) Has the pt taken Rx'd pain medication, regularly or PRN?   5) Is the pain medication giving relief?  6) Any fever, chills, nausea, or vomiting, shortness of breath or tightness in calf? No  7) Is the bandage clean, dry and intact? Yes  8) Is there excessive tightness, bleeding or drainage coming through the bandage?  9) Did you understand all of the post op instruction sheet given?  10) Any questions or concerns regarding post op care/recovery? No    Confirmed POV appointment with patient

## 2018-03-27 ENCOUNTER — Encounter: Payer: Self-pay | Admitting: Podiatry

## 2018-03-27 ENCOUNTER — Ambulatory Visit (INDEPENDENT_AMBULATORY_CARE_PROVIDER_SITE_OTHER): Payer: Medicare Other

## 2018-03-27 ENCOUNTER — Ambulatory Visit (INDEPENDENT_AMBULATORY_CARE_PROVIDER_SITE_OTHER): Payer: Medicare Other | Admitting: Podiatry

## 2018-03-27 VITALS — BP 128/69 | HR 88 | Temp 98.0°F | Resp 16

## 2018-03-27 DIAGNOSIS — L989 Disorder of the skin and subcutaneous tissue, unspecified: Secondary | ICD-10-CM

## 2018-03-27 DIAGNOSIS — M21962 Unspecified acquired deformity of left lower leg: Secondary | ICD-10-CM | POA: Diagnosis not present

## 2018-03-27 DIAGNOSIS — M7752 Other enthesopathy of left foot: Secondary | ICD-10-CM

## 2018-03-27 NOTE — Progress Notes (Signed)
Subjective:  Patient ID: Angela Davidson, female    DOB: 1940/12/21,  MRN: [id number]  Chief Complaint  Patient presents with  . Routine Post Op     pov#1 dos 03.04.2020 Metatarsal Osteotomy 3rd Lt, Exc. Benign Lesion 1.0 cm Lt Pt. states," doing great, the top feels like it's scrathcin, other than that no pain." Tx: cam boot, abx and tyelonl -pt deneis any issues    DOS: 03/22/2018 Procedure: L 3rd Weil Osteotomy, Excision of benign lesion  78 y.o. female returns for post-op check. Doing well no pain. Taking only tylenol.  Review of Systems: Negative except as noted in the HPI. Denies N/V/F/Ch.  Past Medical History:  Diagnosis Date  . Anemia   . Depression   . 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 Hypertension     Current Outpatient Medications:  .  Biotin (BIOTIN MAXIMUM STRENGTH) 10 MG TABS, Take 1 tablet by mouth daily., Disp: , Rfl:  .  buPROPion (WELLBUTRIN) 100 MG tablet, , Disp: , Rfl:  .  Calcium Carbonate-Vitamin D (CALCIUM 600+D PO), Take 1 tablet by mouth daily., Disp: , Rfl:  .  cephALEXin (KEFLEX) 500 MG capsule, Take 1 capsule (500 mg total) by mouth 2 (two) times daily., Disp: 14 capsule, Rfl: 0 .  Cholecalciferol (VITAMIN D3) 5000 units TABS, Take 1 tablet by mouth daily., Disp: , Rfl:  .  escitalopram (LEXAPRO) 5 MG tablet, Take 5 mg by mouth at bedtime., Disp: , Rfl:  .  LORazepam (ATIVAN) 1 MG tablet, Take 1 mg by mouth at bedtime., Disp: , Rfl:  .  ondansetron (ZOFRAN) 4 MG tablet, Take 1 tablet (4 mg total) by mouth every 8 (eight) hours as needed for nausea or vomiting., Disp: 20 tablet, Rfl: 0 .  oxyCODONE-acetaminophen (PERCOCET) 10-325 MG tablet, Take 1 tablet by mouth every 4 (four) hours as needed for pain., Disp: 20 tablet, Rfl: 0 .  vitamin B-12 (CYANOCOBALAMIN) 1000 MCG tablet, Take 1,000 mcg by mouth daily., Disp: , Rfl:   Social History   Tobacco Use  Smoking Status Former Smoker  . Types: Cigarettes  . Last  attempt to quit: 02/19/1988  . Years since quitting: 30.1  Smokeless Tobacco Never Used    Allergies  Allergen Reactions  . Morphine And Related Nausea Only    Headaches  . Codeine Nausea Only  . Hydrocodone Nausea Only   Objective:   Vitals:   03/27/18 1353  BP: 128/69  Pulse: 88  Resp: 16  Temp: 98 F (36.7 C)   There is no height or weight on file to calculate BMI. Constitutional Well developed. Well nourished.  Vascular Foot warm and well perfused. Capillary refill normal to all digits.   Neurologic Normal speech. Oriented to person, place, and time. Epicritic sensation to light touch grossly present bilaterally.  Dermatologic Skin healing well without signs of infection. Skin edges well coapted without signs of infection.  Orthopedic: Tenderness to palpation noted about the surgical site.   Radiographs: Taken and reviewed c/w post-op state, interval shortening noted approx 1.5 mm Assessment:   1. Capsulitis of metatarsophalangeal (MTP) joint of left foot   2. Metatarsal deformity, left   3. Benign skin lesion    Plan:  Patient was evaluated and treated and all questions answered.  S/p foot surgery left -Progressing as expected post-operatively. -XR: As above -WB Status: WBAt in walker boot -Sutures: intact.. -Medications: none refilled. -Foot redressed.  No follow-ups on  file.

## 2018-04-03 ENCOUNTER — Encounter: Payer: Medicare Other | Admitting: Podiatry

## 2018-04-07 ENCOUNTER — Encounter: Payer: Self-pay | Admitting: Sports Medicine

## 2018-04-07 ENCOUNTER — Other Ambulatory Visit: Payer: Self-pay

## 2018-04-07 ENCOUNTER — Ambulatory Visit (INDEPENDENT_AMBULATORY_CARE_PROVIDER_SITE_OTHER): Payer: Medicare Other | Admitting: Sports Medicine

## 2018-04-07 VITALS — BP 140/81 | HR 68 | Temp 97.4°F | Resp 16

## 2018-04-07 DIAGNOSIS — L989 Disorder of the skin and subcutaneous tissue, unspecified: Secondary | ICD-10-CM

## 2018-04-07 DIAGNOSIS — M25572 Pain in left ankle and joints of left foot: Secondary | ICD-10-CM

## 2018-04-07 DIAGNOSIS — Z9889 Other specified postprocedural states: Secondary | ICD-10-CM

## 2018-04-07 DIAGNOSIS — M7752 Other enthesopathy of left foot: Secondary | ICD-10-CM

## 2018-04-07 DIAGNOSIS — M21962 Unspecified acquired deformity of left lower leg: Secondary | ICD-10-CM

## 2018-04-07 NOTE — Progress Notes (Signed)
Subjective: Angela Davidson is a 78 y.o. female patient seen today in office for POV #2 (DOS 03-22-18), S/P Left 3rd metatarsal osteotomy and excision of benign lesion with Dr. March Rummage. Patient denies pain at surgical site, denies calf pain, denies headache, chest pain, shortness of breath, nausea, vomiting, fever, or chills. Patient states that she is doing well. No other issues noted.   Patient Active Problem List   Diagnosis Date Noted  . Prediabetes 03/12/2017  . Malaise and fatigue 07/19/2016  . Mild episode of recurrent major depressive disorder ([place]) 07/19/2016  . Alcoholism ([place]) 07/09/2015  . Alopecia 07/09/2015  . Anxiety disorder 07/09/2015  . High risk medication use 07/09/2015  . Insomnia 07/09/2015  . Mixed hyperlipidemia 07/09/2015  . Osteoarthritis of multiple joints 07/09/2015  . Osteoporosis 07/09/2015  . 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 Vitamin B12 deficiency 07/09/2015  . Vitamin D deficiency 07/09/2015  . Spondylolisthesis of lumbar region 02/27/2015    Current Outpatient Medications on File Prior to Visit  Medication Sig Dispense Refill  . Biotin (BIOTIN MAXIMUM STRENGTH) 10 MG TABS Take 1 tablet by mouth daily.    Marland Kitchen buPROPion (WELLBUTRIN) 100 MG tablet     . Calcium Carbonate-Vitamin D (CALCIUM 600+D PO) Take 1 tablet by mouth daily.    . cephALEXin (KEFLEX) 500 MG capsule Take 1 capsule (500 mg total) by mouth 2 (two) times daily. 14 capsule 0  . Cholecalciferol (VITAMIN D3) 5000 units TABS Take 1 tablet by mouth daily.    Marland Kitchen escitalopram (LEXAPRO) 5 MG tablet Take 5 mg by mouth at bedtime.    Marland Kitchen LORazepam (ATIVAN) 1 MG tablet Take 1 mg by mouth at bedtime.    . ondansetron (ZOFRAN) 4 MG tablet Take 1 tablet (4 mg total) by mouth every 8 (eight) hours as needed for nausea or vomiting. 20 tablet 0  . oxyCODONE-acetaminophen (PERCOCET) 10-325 MG tablet Take 1 tablet by mouth every 4 (four) hours as needed for pain. 20 tablet 0  . vitamin B-12 (CYANOCOBALAMIN) 1000 MCG  tablet Take 1,000 mcg by mouth daily.     No current facility-administered medications on file prior to visit.     Allergies  Allergen Reactions  . Morphine And Related Nausea Only    Headaches  . Codeine Nausea Only  . Hydrocodone Nausea Only    Objective: There were no vitals filed for this visit.  General: No acute distress, AAOx3  Left foot: Sutures intact with no gapping or dehiscence at surgical site, mild swelling to left foot, no erythema, no warmth, no drainage, no signs of infection noted, Capillary fill time <3 seconds in all digits, gross sensation present via light touch to left foot. No pain or crepitation with range of motion left foot.  No pain with calf compression.   Assessment and Plan:  Problem List Items Addressed This Visit    None    Visit Diagnoses    S/P foot surgery, left    -  Primary   Capsulitis of metatarsophalangeal (MTP) joint of left foot       Metatarsal deformity, left       Benign skin lesion       Pain in joint of left foot          -Patient seen and evaluated -Sutures removed -Applied dry sterile dressing to surgical site left foot secured with ACE wrap and stockinet  -Patient may remove dressings tomorrow for shower and allow steri-strips to fall off on there own; pressure  to heel only when showering -Advised patient to continue with CAM boot -Advised patient to limit activity to necessity  -Advised patient to ice and elevate as instructed and PRN meds -Will plan for post op check and xray with Dr. March Rummage at next office visit. In the meantime, patient to call office if any issues or problems arise.   Landis Martins, DPM

## 2018-04-18 ENCOUNTER — Other Ambulatory Visit: Payer: Self-pay

## 2018-04-18 ENCOUNTER — Ambulatory Visit (INDEPENDENT_AMBULATORY_CARE_PROVIDER_SITE_OTHER): Payer: Medicare Other | Admitting: Podiatry

## 2018-04-18 ENCOUNTER — Ambulatory Visit (INDEPENDENT_AMBULATORY_CARE_PROVIDER_SITE_OTHER): Payer: Medicare Other

## 2018-04-18 ENCOUNTER — Encounter: Payer: Self-pay | Admitting: Podiatry

## 2018-04-18 VITALS — BP 145/77 | HR 70 | Resp 16

## 2018-04-18 DIAGNOSIS — M21962 Unspecified acquired deformity of left lower leg: Secondary | ICD-10-CM

## 2018-04-18 DIAGNOSIS — M7752 Other enthesopathy of left foot: Secondary | ICD-10-CM

## 2018-04-18 DIAGNOSIS — M25572 Pain in left ankle and joints of left foot: Secondary | ICD-10-CM | POA: Diagnosis not present

## 2018-04-18 DIAGNOSIS — Z9889 Other specified postprocedural states: Secondary | ICD-10-CM

## 2018-04-18 NOTE — Progress Notes (Signed)
  Subjective:  Patient ID: Angela Davidson, female    DOB: 1940/01/29,  MRN: [id number]  Chief Complaint  Patient presents with  . Routine Post Op    pov dos 03.04.2020 Metatarsal Osteotomy 3rd Lt, Exc. Benign Lesion 1.0 cm Lt Pt. states," foot doirn great." tx: cam boot -pt denies any issues pt deneis N/V/F?Ch     DOS: 03/22/2018 Procedure: L 3rd Weil Osteotomy, Excision of benign lesion  78 y.o. female returns for post-op check. Hx as above.  Review of Systems: Negative except as noted in the HPI. Denies N/V/F/Ch.  Past Medical History:  Diagnosis Date  . Anemia   . Depression   . 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 Hypertension     Current Outpatient Medications:  .  Biotin (BIOTIN MAXIMUM STRENGTH) 10 MG TABS, Take 1 tablet by mouth daily., Disp: , Rfl:  .  buPROPion (WELLBUTRIN) 100 MG tablet, , Disp: , Rfl:  .  Calcium Carbonate-Vitamin D (CALCIUM 600+D PO), Take 1 tablet by mouth daily., Disp: , Rfl:  .  cephALEXin (KEFLEX) 500 MG capsule, Take 1 capsule (500 mg total) by mouth 2 (two) times daily., Disp: 14 capsule, Rfl: 0 .  Cholecalciferol (VITAMIN D3) 5000 units TABS, Take 1 tablet by mouth daily., Disp: , Rfl:  .  escitalopram (LEXAPRO) 5 MG tablet, Take 5 mg by mouth at bedtime., Disp: , Rfl:  .  LORazepam (ATIVAN) 1 MG tablet, Take 1 mg by mouth at bedtime., Disp: , Rfl:  .  ondansetron (ZOFRAN) 4 MG tablet, Take 1 tablet (4 mg total) by mouth every 8 (eight) hours as needed for nausea or vomiting., Disp: 20 tablet, Rfl: 0 .  oxyCODONE-acetaminophen (PERCOCET) 10-325 MG tablet, Take 1 tablet by mouth every 4 (four) hours as needed for pain., Disp: 20 tablet, Rfl: 0 .  vitamin B-12 (CYANOCOBALAMIN) 1000 MCG tablet, Take 1,000 mcg by mouth daily., Disp: , Rfl:   Social History   Tobacco Use  Smoking Status Former Smoker  . Types: Cigarettes  . Last attempt to quit: 02/19/1988  . Years since quitting: 30.1  Smokeless Tobacco Never  Used    Allergies  Allergen Reactions  . Morphine And Related Nausea Only    Headaches  . Codeine Nausea Only  . Hydrocodone Nausea Only   Objective:   Vitals:   04/18/18 1349  BP: (!) 145/77  Pulse: 70  Resp: 16   There is no height or weight on file to calculate BMI. Constitutional Well developed. Well nourished.  Vascular Foot warm and well perfused. Capillary refill normal to all digits.   Neurologic Normal speech. Oriented to person, place, and time. Epicritic sensation to light touch grossly present bilaterally.  Dermatologic Skin healed without signs of infection  Orthopedic: No tenderness to palpation noted about the surgical site.   Radiographs: Taken and reviewed. Osteotomy healed. Assessment:   1. Capsulitis of metatarsophalangeal (MTP) joint of left foot   2. Metatarsal deformity, left   3. Pain in joint of left foot   4. S/P foot surgery, left    Plan:  Patient was evaluated and treated and all questions answered.  S/p foot surgery left -Progressing as expected post-operatively. -XR: As above -WB Status: WBAt in sx shoe -Sutures: out -Medications: none refilled. -Foot redressed.  Return in about 4 weeks (around 05/16/2018) for Post-op, XRs.

## 2018-05-16 ENCOUNTER — Ambulatory Visit (INDEPENDENT_AMBULATORY_CARE_PROVIDER_SITE_OTHER): Payer: Medicare Other | Admitting: Podiatry

## 2018-05-16 ENCOUNTER — Other Ambulatory Visit: Payer: Self-pay

## 2018-05-16 ENCOUNTER — Encounter: Payer: Self-pay | Admitting: Podiatry

## 2018-05-16 ENCOUNTER — Ambulatory Visit (INDEPENDENT_AMBULATORY_CARE_PROVIDER_SITE_OTHER): Payer: Medicare Other

## 2018-05-16 VITALS — Temp 97.3°F | Resp 16

## 2018-05-16 DIAGNOSIS — M2042 Other hammer toe(s) (acquired), left foot: Secondary | ICD-10-CM

## 2018-05-16 DIAGNOSIS — M21962 Unspecified acquired deformity of left lower leg: Secondary | ICD-10-CM | POA: Diagnosis not present

## 2018-05-16 DIAGNOSIS — M25572 Pain in left ankle and joints of left foot: Secondary | ICD-10-CM

## 2018-05-16 DIAGNOSIS — M7752 Other enthesopathy of left foot: Secondary | ICD-10-CM

## 2018-05-16 DIAGNOSIS — Z9889 Other specified postprocedural states: Secondary | ICD-10-CM | POA: Diagnosis not present

## 2018-05-16 NOTE — Progress Notes (Signed)
Subjective:  Patient ID: Angela Davidson, female    DOB: May 18, 1940,  MRN: [id number]  Chief Complaint  Patient presents with  . Routine Post Op    POV dos 03.04.2020 Metatarsal Osteotomy 3rd Lt, Exc. Benign Lesion 1.0 cm Lt Pt. states," doing good, except the callus is back and it's sore." tx: none -pt denies N?v/F/ch     DOS: 03/22/2018 Procedure: L 3rd Weil Osteotomy, Excision of benign lesion  78 y.o. female returns for post-op check. Hx as above.  Review of Systems: Negative except as noted in the HPI. Denies N/V/F/Ch.  Past Medical History:  Diagnosis Date  . Anemia   . Depression   . 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 Hypertension     Current Outpatient Medications:  .  Biotin (BIOTIN MAXIMUM STRENGTH) 10 MG TABS, Take 1 tablet by mouth daily., Disp: , Rfl:  .  buPROPion (WELLBUTRIN) 100 MG tablet, , Disp: , Rfl:  .  Calcium Carbonate-Vitamin D (CALCIUM 600+D PO), Take 1 tablet by mouth daily., Disp: , Rfl:  .  cephALEXin (KEFLEX) 500 MG capsule, Take 1 capsule (500 mg total) by mouth 2 (two) times daily., Disp: 14 capsule, Rfl: 0 .  Cholecalciferol (VITAMIN D3) 5000 units TABS, Take 1 tablet by mouth daily., Disp: , Rfl:  .  escitalopram (LEXAPRO) 5 MG tablet, Take 5 mg by mouth at bedtime., Disp: , Rfl:  .  LORazepam (ATIVAN) 1 MG tablet, Take 1 mg by mouth at bedtime., Disp: , Rfl:  .  ondansetron (ZOFRAN) 4 MG tablet, Take 1 tablet (4 mg total) by mouth every 8 (eight) hours as needed for nausea or vomiting., Disp: 20 tablet, Rfl: 0 .  oxyCODONE-acetaminophen (PERCOCET) 10-325 MG tablet, Take 1 tablet by mouth every 4 (four) hours as needed for pain., Disp: 20 tablet, Rfl: 0 .  vitamin B-12 (CYANOCOBALAMIN) 1000 MCG tablet, Take 1,000 mcg by mouth daily., Disp: , Rfl:   Social History   Tobacco Use  Smoking Status Former Smoker  . Types: Cigarettes  . Last attempt to quit: 02/19/1988  . Years since quitting: 30.2  Smokeless  Tobacco Never Used    Allergies  Allergen Reactions  . Morphine And Related Nausea Only    Headaches  . Codeine Nausea Only  . Hydrocodone Nausea Only   Objective:   Vitals:   05/16/18 1400  Resp: 16  Temp: (!) 97.3 F (36.3 C)   There is no height or weight on file to calculate BMI. Constitutional Well developed. Well nourished.  Vascular Foot warm and well perfused. Capillary refill normal to all digits.   Neurologic Normal speech. Oriented to person, place, and time. Epicritic sensation to light touch grossly present bilaterally.  Dermatologic Skin healed. Small punctate keratosis submet 3 left  Orthopedic: No tenderness to palpation noted about the surgical site.   Radiographs: Taken and reviewed. Osteotomy healed. Assessment:   1. Capsulitis of metatarsophalangeal (MTP) joint of left foot   2. Metatarsal deformity, left   3. Pain in joint of left foot   4. S/P foot surgery, left   5. Hammertoe of left foot    Plan:  Patient was evaluated and treated and all questions answered.  S/p foot surgery left -Progressing as expected post-operatively. -XR taken as above. -Continue WBAT in normal shoe -Callus debrided, salinocaine applied -Discussed fat pad atrophy as contributing to possible recurrence. Discussed the metatarsal parabola is improved and normal s/p surgery.  Return in about 6 weeks (around 06/27/2018) for Capsulitis.

## 2018-06-27 ENCOUNTER — Ambulatory Visit: Payer: Medicare Other | Admitting: Podiatry

## 2018-06-27 ENCOUNTER — Other Ambulatory Visit: Payer: Self-pay

## 2018-06-27 ENCOUNTER — Encounter: Payer: Self-pay | Admitting: Podiatry

## 2018-06-27 VITALS — Temp 96.0°F | Resp 16

## 2018-06-27 DIAGNOSIS — M21962 Unspecified acquired deformity of left lower leg: Secondary | ICD-10-CM

## 2018-06-27 DIAGNOSIS — M7752 Other enthesopathy of left foot: Secondary | ICD-10-CM

## 2018-06-27 NOTE — Progress Notes (Signed)
  Subjective:  Patient ID: Angela Davidson, female    DOB: 19-Sep-1940,  MRN: [id number]  Chief Complaint  Patient presents with  . Routine Post Op    dos 03.04.2020 Metatarsal Osteotomy 3rd Lt, Exc. Benign Lesion 1.0 cm Lt . Pt. states," doing really good. Swells once in a while, but otherwise it's fine; no poain." Tx: none -pt denies issues/symptoms    DOS: 03/22/2018 Procedure: L 3rd Weil Osteotomy, Excision of benign lesion  78 y.o. female returns for post-op check. Hx as above.  Ambulating in normal shoe gear without pain or issues  Review of Systems: Negative except as noted in the HPI. Denies N/V/F/Ch.  Past Medical History:  Diagnosis Date  . Anemia   . Depression   . 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 Hypertension     Current Outpatient Medications:  .  Biotin (BIOTIN MAXIMUM STRENGTH) 10 MG TABS, Take 1 tablet by mouth daily., Disp: , Rfl:  .  buPROPion (WELLBUTRIN) 100 MG tablet, , Disp: , Rfl:  .  Calcium Carbonate-Vitamin D (CALCIUM 600+D PO), Take 1 tablet by mouth daily., Disp: , Rfl:  .  cephALEXin (KEFLEX) 500 MG capsule, Take 1 capsule (500 mg total) by mouth 2 (two) times daily., Disp: 14 capsule, Rfl: 0 .  Cholecalciferol (VITAMIN D3) 5000 units TABS, Take 1 tablet by mouth daily., Disp: , Rfl:  .  escitalopram (LEXAPRO) 5 MG tablet, Take 5 mg by mouth at bedtime., Disp: , Rfl:  .  LORazepam (ATIVAN) 1 MG tablet, Take 1 mg by mouth at bedtime., Disp: , Rfl:  .  ondansetron (ZOFRAN) 4 MG tablet, Take 1 tablet (4 mg total) by mouth every 8 (eight) hours as needed for nausea or vomiting., Disp: 20 tablet, Rfl: 0 .  oxyCODONE-acetaminophen (PERCOCET) 10-325 MG tablet, Take 1 tablet by mouth every 4 (four) hours as needed for pain., Disp: 20 tablet, Rfl: 0 .  vitamin B-12 (CYANOCOBALAMIN) 1000 MCG tablet, Take 1,000 mcg by mouth daily., Disp: , Rfl:   Social History   Tobacco Use  Smoking Status Former Smoker  . Types: Cigarettes   . Last attempt to quit: 02/19/1988  . Years since quitting: 30.3  Smokeless Tobacco Never Used    Allergies  Allergen Reactions  . Morphine And Related Nausea Only    Headaches  . Codeine Nausea Only  . Hydrocodone Nausea Only   Objective:   Vitals:   06/27/18 1340  Resp: 16  Temp: (!) 96 F (35.6 C)   There is no height or weight on file to calculate BMI. Constitutional Well developed. Well nourished.  Vascular Foot warm and well perfused. Capillary refill normal to all digits.   Neurologic Normal speech. Oriented to person, place, and time. Epicritic sensation to light touch grossly present bilaterally.  Dermatologic Skin healed no hyperkeratosis  Orthopedic:  No pain palpation of the left third metatarsophalangeal joint. No pain with stretch of the third MPJ.   Radiographs: None Assessment:   1. Capsulitis of metatarsophalangeal (MTP) joint of left foot   2. Metatarsal deformity, left    Plan:  Patient was evaluated and treated and all questions answered.  S/p foot surgery left -Doing well postoperatively ambulating in normal shoe without pain or issues.  Will discharge with follow-up as needed.  No follow-ups on file.

## 2019-08-29 ENCOUNTER — Other Ambulatory Visit: Payer: Self-pay | Admitting: Neurosurgery

## 2019-09-13 ENCOUNTER — Encounter (HOSPITAL_COMMUNITY): Payer: Self-pay

## 2019-09-13 NOTE — Progress Notes (Signed)
[facility] [place], [place] - [address] AT [address] [place] [address] Phone: [phone] Fax: [phone]      Your procedure is scheduled on 09/21/19.  Report to [facility] Main Entrance "A" at 10:00 A.M., and check in at the Admitting office.  Call this number if you have problems the morning of surgery:  [phone]  Call [phone] if you have any questions prior to your surgery date Monday-Friday 8am-4pm    Remember:  Do not eat or drink after midnight the night before your surgery    Take these medicines the morning of surgery with A SIP OF WATER: cephALEXin (KEFLEX)  As of today, STOP taking any Aspirin (unless otherwise instructed by your surgeon) Aleve, Naproxen, Ibuprofen, Motrin, Advil, Goody's, BC's, all herbal medications, fish oil, and all vitamins.                      Do not wear jewelry, make up, or nail polish            Do not wear lotions, powders, perfumes or deodorant.            Do not shave 48 hours prior to surgery.              Do not bring valuables to the hospital.            [facility] is not responsible for any belongings or valuables.  Do NOT Smoke (Tobacco/Vaping) or drink Alcohol 24 hours prior to your procedure If you use a CPAP at night, you may bring all equipment for your overnight stay.   Contacts, glasses, dentures or bridgework may not be worn into surgery.      For patients admitted to the hospital, discharge time will be determined by your treatment team.   Patients discharged the day of surgery will not be allowed to drive home, and someone needs to stay with them for 24 hours.    Special instructions:   [place]- Preparing For Surgery  Before surgery, you can play an important role. Because skin is not sterile, your skin needs to be as free of germs as possible. You can reduce the number of germs on your skin by washing with CHG (chlorahexidine gluconate) Soap  before surgery.  CHG is an antiseptic cleaner which kills germs and bonds with the skin to continue killing germs even after washing.    Oral Hygiene is also important to reduce your risk of infection.  Remember - BRUSH YOUR TEETH THE MORNING OF SURGERY WITH YOUR REGULAR TOOTHPASTE  Please do not use if you have an allergy to CHG or antibacterial soaps. If your skin becomes reddened/irritated stop using the CHG.  Do not shave (including legs and underarms) for at least 48 hours prior to first CHG shower. It is OK to shave your face.  Please follow these instructions carefully.   1. Shower the NIGHT BEFORE SURGERY and the MORNING OF SURGERY with CHG Soap.   2. If you chose to wash your hair, wash your hair first as usual with your normal shampoo.  3. After you shampoo, rinse your hair and body thoroughly to remove the shampoo.  4. Use CHG as you would any other liquid soap. You can apply CHG directly to the skin and wash gently with a scrungie or a clean washcloth.   5. Apply the CHG Soap to your body ONLY FROM THE  NECK DOWN.  Do not use on open wounds or open sores. Avoid contact with your eyes, ears, mouth and genitals (private parts). Wash Face and genitals (private parts)  with your normal soap.   6. Wash thoroughly, paying special attention to the area where your surgery will be performed.  7. Thoroughly rinse your body with warm water from the neck down.  8. DO NOT shower/wash with your normal soap after using and rinsing off the CHG Soap.  9. Pat yourself dry with a CLEAN TOWEL.  10. Wear CLEAN PAJAMAS to bed the night before surgery  11. Place CLEAN SHEETS on your bed the night of your first shower and DO NOT SLEEP WITH PETS.   Day of Surgery: Wear Clean/Comfortable clothing the morning of surgery Do not apply any deodorants/lotions.   Remember to brush your teeth WITH YOUR REGULAR TOOTHPASTE.   Please read over the following fact sheets that you were given.

## 2019-09-14 ENCOUNTER — Other Ambulatory Visit: Payer: Self-pay

## 2019-09-14 ENCOUNTER — Encounter (HOSPITAL_COMMUNITY): Payer: Self-pay

## 2019-09-14 ENCOUNTER — Encounter (HOSPITAL_COMMUNITY)
Admission: RE | Admit: 2019-09-14 | Discharge: 2019-09-14 | Disposition: A | Discharge status: Home / Self Care | Payer: Medicare Other | Source: Ambulatory Visit | Attending: Neurosurgery | Admitting: Neurosurgery

## 2019-09-14 DIAGNOSIS — Z01812 Encounter for preprocedural laboratory examination: Secondary | ICD-10-CM | POA: Insufficient documentation

## 2019-09-14 LAB — BASIC METABOLIC PANEL
Anion gap: 11 (ref 5–15)
BUN: 12 mg/dL (ref 8–23)
CO2: 24 mmol/L (ref 22–32)
Calcium: 9.1 mg/dL (ref 8.9–10.3)
Chloride: 105 mmol/L (ref 98–111)
Creatinine, Ser: 0.65 mg/dL (ref 0.44–1.00)
GFR calc Af Amer: >60 mL/min (ref >60)
GFR calc non Af Amer: >60 mL/min (ref >60)
Glucose, Bld: 111 mg/dL — ABNORMAL HIGH (ref 70–99)
Potassium: 3.8 mmol/L (ref 3.5–5.1)
Sodium: 140 mmol/L (ref 135–145)

## 2019-09-14 LAB — CBC
HCT: 43.1 % (ref 36.0–46.0)
Hemoglobin: 13.5 g/dL (ref 12.0–15.0)
MCH: 28.6 pg (ref 26.0–34.0)
MCHC: 31.3 g/dL (ref 30.0–36.0)
MCV: 91.3 fL (ref 80.0–100.0)
Platelets: 284 10*3/uL (ref 150–400)
RBC: 4.72 MIL/uL (ref 3.87–5.11)
RDW: 13.3 % (ref 11.5–15.5)
WBC: 7.8 10*3/uL (ref 4.0–10.5)
nRBC: 0 % (ref 0.0–0.2)

## 2019-09-14 LAB — TYPE AND SCREEN
ABO/RH(D): O POS
Antibody Screen: NEGATIVE

## 2019-09-14 LAB — SURGICAL PCR SCREEN
MRSA, PCR: NEGATIVE
Staphylococcus aureus: NEGATIVE

## 2019-09-14 NOTE — Progress Notes (Signed)
PCP: Gilford Rile, MD Cardiologist:  Denies  EKG: N/A CXR: N/A ECHO:  Denies Stress Test:  Denies Cardiac Cath:  Denies  Covid test 09/18/19  Patient denies shortness of breath, fever, cough, and chest pain at PAT appointment.  Patient verbalized understanding of instructions provided today at the PAT appointment.  Patient asked to review instructions at home and day of surgery.

## 2019-09-18 ENCOUNTER — Other Ambulatory Visit (HOSPITAL_COMMUNITY)
Admission: RE | Admit: 2019-09-18 | Discharge: 2019-09-18 | Disposition: A | Discharge status: Home / Self Care | Payer: Medicare Other | Source: Ambulatory Visit | Attending: Neurosurgery | Admitting: Neurosurgery

## 2019-09-18 DIAGNOSIS — Z20822 Contact with and (suspected) exposure to covid-19: Secondary | ICD-10-CM | POA: Insufficient documentation

## 2019-09-18 DIAGNOSIS — Z01812 Encounter for preprocedural laboratory examination: Secondary | ICD-10-CM | POA: Insufficient documentation

## 2019-09-18 LAB — SARS CORONAVIRUS 2 (TAT 6-24 HRS): SARS Coronavirus 2: NEGATIVE

## 2019-09-21 ENCOUNTER — Encounter (HOSPITAL_COMMUNITY): Payer: Self-pay | Admitting: Neurosurgery

## 2019-09-21 ENCOUNTER — Inpatient Hospital Stay (HOSPITAL_COMMUNITY): Payer: Medicare Other | Admitting: Anesthesiology

## 2019-09-21 ENCOUNTER — Inpatient Hospital Stay (HOSPITAL_COMMUNITY)
Admission: RE | Admit: 2019-09-21 | Discharge: 2019-09-27 | DRG: 460 | Disposition: A | Discharge status: Home / Self Care | Payer: Medicare Other | Attending: Neurosurgery | Admitting: Neurosurgery

## 2019-09-21 ENCOUNTER — Inpatient Hospital Stay (HOSPITAL_COMMUNITY): Payer: Medicare Other

## 2019-09-21 ENCOUNTER — Encounter (HOSPITAL_COMMUNITY)
Admission: RE | Disposition: A | Discharge status: Home / Self Care | Payer: Self-pay | Source: Home / Self Care | Attending: Neurosurgery

## 2019-09-21 DIAGNOSIS — Z419 Encounter for procedure for purposes other than remedying health state, unspecified: Secondary | ICD-10-CM

## 2019-09-21 DIAGNOSIS — Z20822 Contact with and (suspected) exposure to covid-19: Secondary | ICD-10-CM | POA: Diagnosis present

## 2019-09-21 DIAGNOSIS — M4316 Spondylolisthesis, lumbar region: Secondary | ICD-10-CM | POA: Diagnosis present

## 2019-09-21 DIAGNOSIS — M47816 Spondylosis without myelopathy or radiculopathy, lumbar region: Secondary | ICD-10-CM | POA: Diagnosis present

## 2019-09-21 DIAGNOSIS — M48062 Spinal stenosis, lumbar region with neurogenic claudication: Principal | ICD-10-CM | POA: Diagnosis present

## 2019-09-21 DIAGNOSIS — Z833 Family history of diabetes mellitus: Secondary | ICD-10-CM

## 2019-09-21 DIAGNOSIS — Z87891 Personal history of nicotine dependence: Secondary | ICD-10-CM

## 2019-09-21 DIAGNOSIS — Z825 Family history of asthma and other chronic lower respiratory diseases: Secondary | ICD-10-CM | POA: Diagnosis not present

## 2019-09-21 DIAGNOSIS — Z8249 Family history of ischemic heart disease and other diseases of the circulatory system: Secondary | ICD-10-CM | POA: Diagnosis not present

## 2019-09-21 DIAGNOSIS — Z885 Allergy status to narcotic agent status: Secondary | ICD-10-CM

## 2019-09-21 SURGERY — POSTERIOR LUMBAR FUSION 1 LEVEL
Anesthesia: General

## 2019-09-21 MED ORDER — ACETAMINOPHEN 650 MG RE SUPP: 650.0000 mg | RECTAL | Status: DC | PRN

## 2019-09-21 MED ORDER — ACETAMINOPHEN 10 MG/ML IV SOLN: 1000.0000 mg | Freq: Once | INTRAVENOUS | Status: DC | PRN

## 2019-09-21 MED ORDER — LORAZEPAM 1 MG PO TABS
1.0000 mg | ORAL_TABLET | Freq: Every day | ORAL | Status: DC
  Administered 2019-09-21 – 2019-09-26 (×6): 1 mg via ORAL
  Filled 2019-09-21 (×6): qty 1

## 2019-09-21 MED ORDER — SUGAMMADEX SODIUM 200 MG/2ML IV SOLN
INTRAVENOUS | Status: DC | PRN
  Administered 2019-09-21: 150 mg via INTRAVENOUS

## 2019-09-21 MED ORDER — ZOLPIDEM TARTRATE 5 MG PO TABS
5.0000 mg | ORAL_TABLET | Freq: Every evening | ORAL | Status: DC | PRN
  Administered 2019-09-23: 5 mg via ORAL
  Filled 2019-09-21: qty 1

## 2019-09-21 MED ORDER — ALBUMIN HUMAN 5 % IV SOLN: INTRAVENOUS | Status: DC | PRN

## 2019-09-21 MED ORDER — BUPIVACAINE HCL (PF) 0.5 % IJ SOLN
INTRAMUSCULAR | Status: DC | PRN
  Administered 2019-09-21: 25 mL

## 2019-09-21 MED ORDER — SODIUM CHLORIDE 0.9 % IV SOLN: 250.0000 mL | INTRAVENOUS | Status: DC

## 2019-09-21 MED ORDER — ORAL CARE MOUTH RINSE: 15.0000 mL | Freq: Once | OROMUCOSAL | Status: AC

## 2019-09-21 MED ORDER — DEXAMETHASONE SODIUM PHOSPHATE 4 MG/ML IJ SOLN
INTRAMUSCULAR | Status: DC | PRN
  Administered 2019-09-21: 4 mg via INTRAVENOUS

## 2019-09-21 MED ORDER — DIAZEPAM 5 MG PO TABS
5.0000 mg | ORAL_TABLET | Freq: Four times a day (QID) | ORAL | Status: DC | PRN
  Administered 2019-09-22 – 2019-09-24 (×5): 5 mg via ORAL
  Filled 2019-09-21 (×5): qty 1

## 2019-09-21 MED ORDER — DEXAMETHASONE SODIUM PHOSPHATE 10 MG/ML IJ SOLN
INTRAMUSCULAR | Status: AC
  Filled 2019-09-21: qty 1

## 2019-09-21 MED ORDER — HYDROCODONE-ACETAMINOPHEN 7.5-325 MG PO TABS
1.0000 | ORAL_TABLET | ORAL | Status: DC | PRN
  Administered 2019-09-21 – 2019-09-22 (×2): 1 via ORAL
  Filled 2019-09-21: qty 1

## 2019-09-21 MED ORDER — OXYCODONE HCL ER 10 MG PO T12A
10.0000 mg | EXTENDED_RELEASE_TABLET | Freq: Two times a day (BID) | ORAL | Status: DC
  Filled 2019-09-21: qty 1

## 2019-09-21 MED ORDER — BISACODYL 5 MG PO TBEC
5.0000 mg | DELAYED_RELEASE_TABLET | Freq: Every day | ORAL | Status: DC | PRN
  Administered 2019-09-22 – 2019-09-26 (×2): 5 mg via ORAL
  Filled 2019-09-21 (×2): qty 1

## 2019-09-21 MED ORDER — ROCURONIUM BROMIDE 10 MG/ML (PF) SYRINGE
PREFILLED_SYRINGE | INTRAVENOUS | Status: DC | PRN
  Administered 2019-09-21: 30 mg via INTRAVENOUS
  Administered 2019-09-21: 10 mg via INTRAVENOUS
  Administered 2019-09-21: 20 mg via INTRAVENOUS
  Administered 2019-09-21: 10 mg via INTRAVENOUS
  Administered 2019-09-21: 20 mg via INTRAVENOUS
  Administered 2019-09-21: 40 mg via INTRAVENOUS

## 2019-09-21 MED ORDER — ONDANSETRON HCL 4 MG PO TABS
4.0000 mg | ORAL_TABLET | Freq: Four times a day (QID) | ORAL | Status: DC | PRN
  Administered 2019-09-23: 4 mg via ORAL
  Filled 2019-09-21: qty 1

## 2019-09-21 MED ORDER — THROMBIN 20000 UNITS EX SOLR
CUTANEOUS | Status: DC | PRN
  Administered 2019-09-21: 20 mL via TOPICAL

## 2019-09-21 MED ORDER — PHENYLEPHRINE 40 MCG/ML (10ML) SYRINGE FOR IV PUSH (FOR BLOOD PRESSURE SUPPORT)
PREFILLED_SYRINGE | INTRAVENOUS | Status: AC
  Filled 2019-09-21: qty 10

## 2019-09-21 MED ORDER — OXYCODONE HCL 5 MG PO TABS: 5.0000 mg | ORAL_TABLET | Freq: Once | ORAL | Status: DC | PRN

## 2019-09-21 MED ORDER — VITAMIN B-12 1000 MCG PO TABS
1000.0000 ug | ORAL_TABLET | Freq: Every day | ORAL | Status: DC
  Administered 2019-09-21 – 2019-09-26 (×6): 1000 ug via ORAL
  Filled 2019-09-21 (×6): qty 1

## 2019-09-21 MED ORDER — LIDOCAINE-EPINEPHRINE 0.5 %-1:200000 IJ SOLN
INTRAMUSCULAR | Status: DC | PRN
  Administered 2019-09-21: 10 mL

## 2019-09-21 MED ORDER — CHLORHEXIDINE GLUCONATE 0.12 % MT SOLN
OROMUCOSAL | Status: AC
  Administered 2019-09-21: 15 mL via OROMUCOSAL
  Filled 2019-09-21: qty 15

## 2019-09-21 MED ORDER — LIDOCAINE 2% (20 MG/ML) 5 ML SYRINGE
INTRAMUSCULAR | Status: DC | PRN
  Administered 2019-09-21: 40 mg via INTRAVENOUS

## 2019-09-21 MED ORDER — ONDANSETRON HCL 4 MG/2ML IJ SOLN
INTRAMUSCULAR | Status: DC | PRN
  Administered 2019-09-21: 4 mg via INTRAVENOUS

## 2019-09-21 MED ORDER — LACTATED RINGERS IV SOLN: INTRAVENOUS | Status: DC

## 2019-09-21 MED ORDER — CHLORHEXIDINE GLUCONATE CLOTH 2 % EX PADS
6.0000 | MEDICATED_PAD | Freq: Every day | CUTANEOUS | Status: DC
  Administered 2019-09-22 – 2019-09-23 (×2): 6 via TOPICAL

## 2019-09-21 MED ORDER — ESCITALOPRAM OXALATE 10 MG PO TABS
10.0000 mg | ORAL_TABLET | Freq: Every day | ORAL | Status: DC
  Administered 2019-09-21 – 2019-09-26 (×6): 10 mg via ORAL
  Filled 2019-09-21 (×6): qty 1

## 2019-09-21 MED ORDER — PHENYLEPHRINE 40 MCG/ML (10ML) SYRINGE FOR IV PUSH (FOR BLOOD PRESSURE SUPPORT)
PREFILLED_SYRINGE | INTRAVENOUS | Status: DC | PRN
  Administered 2019-09-21 (×2): 80 ug via INTRAVENOUS
  Administered 2019-09-21: 120 ug via INTRAVENOUS
  Administered 2019-09-21: 80 ug via INTRAVENOUS
  Administered 2019-09-21: 40 ug via INTRAVENOUS

## 2019-09-21 MED ORDER — 0.9 % SODIUM CHLORIDE (POUR BTL) OPTIME
TOPICAL | Status: DC | PRN
  Administered 2019-09-21: 1000 mL

## 2019-09-21 MED ORDER — BUPROPION HCL 100 MG PO TABS
100.0000 mg | ORAL_TABLET | Freq: Every day | ORAL | Status: DC
  Administered 2019-09-21 – 2019-09-26 (×6): 100 mg via ORAL
  Filled 2019-09-21 (×7): qty 1

## 2019-09-21 MED ORDER — CALCIUM CARBONATE-VITAMIN D 500-200 MG-UNIT PO TABS
1.0000 | ORAL_TABLET | Freq: Every day | ORAL | Status: DC
  Filled 2019-09-21: qty 1

## 2019-09-21 MED ORDER — PROPOFOL 10 MG/ML IV BOLUS
INTRAVENOUS | Status: DC | PRN
  Administered 2019-09-21: 110 mg via INTRAVENOUS
  Administered 2019-09-21: 40 mg via INTRAVENOUS

## 2019-09-21 MED ORDER — THROMBIN 20000 UNITS EX SOLR
CUTANEOUS | Status: AC
  Filled 2019-09-21: qty 20000

## 2019-09-21 MED ORDER — CELECOXIB 200 MG PO CAPS
200.0000 mg | ORAL_CAPSULE | Freq: Two times a day (BID) | ORAL | Status: DC
  Administered 2019-09-21 – 2019-09-27 (×12): 200 mg via ORAL
  Filled 2019-09-21 (×12): qty 1

## 2019-09-21 MED ORDER — FUROSEMIDE 20 MG PO TABS
20.0000 mg | ORAL_TABLET | Freq: Every day | ORAL | Status: DC | PRN
  Administered 2019-09-22: 20 mg via ORAL
  Filled 2019-09-21: qty 1

## 2019-09-21 MED ORDER — OXYCODONE HCL 5 MG/5ML PO SOLN: 5.0000 mg | Freq: Once | ORAL | Status: DC | PRN

## 2019-09-21 MED ORDER — POTASSIUM CHLORIDE IN NACL 20-0.9 MEQ/L-% IV SOLN
INTRAVENOUS | Status: DC
  Filled 2019-09-21: qty 1000

## 2019-09-21 MED ORDER — CEFAZOLIN SODIUM-DEXTROSE 2-4 GM/100ML-% IV SOLN
2.0000 g | INTRAVENOUS | Status: AC
  Administered 2019-09-21: 2 g via INTRAVENOUS

## 2019-09-21 MED ORDER — PHENYLEPHRINE HCL-NACL 10-0.9 MG/250ML-% IV SOLN
INTRAVENOUS | Status: DC | PRN
  Administered 2019-09-21: 25 ug/min via INTRAVENOUS

## 2019-09-21 MED ORDER — ACETAMINOPHEN 500 MG PO TABS: 1000.0000 mg | ORAL_TABLET | Freq: Once | ORAL | Status: DC | PRN

## 2019-09-21 MED ORDER — ACETAMINOPHEN 325 MG PO TABS
650.0000 mg | ORAL_TABLET | ORAL | Status: DC | PRN
  Administered 2019-09-23 – 2019-09-27 (×6): 650 mg via ORAL
  Filled 2019-09-21 (×8): qty 2

## 2019-09-21 MED ORDER — ONDANSETRON HCL 4 MG/2ML IJ SOLN
4.0000 mg | Freq: Four times a day (QID) | INTRAMUSCULAR | Status: DC | PRN
  Administered 2019-09-22 – 2019-09-23 (×4): 4 mg via INTRAVENOUS
  Filled 2019-09-21 (×4): qty 2

## 2019-09-21 MED ORDER — ACETAMINOPHEN 160 MG/5ML PO SOLN: 1000.0000 mg | Freq: Once | ORAL | Status: DC | PRN

## 2019-09-21 MED ORDER — DOCUSATE SODIUM 100 MG PO CAPS
100.0000 mg | ORAL_CAPSULE | Freq: Two times a day (BID) | ORAL | Status: DC
  Administered 2019-09-21 – 2019-09-26 (×10): 100 mg via ORAL
  Filled 2019-09-21 (×11): qty 1

## 2019-09-21 MED ORDER — LIDOCAINE-EPINEPHRINE 0.5 %-1:200000 IJ SOLN
INTRAMUSCULAR | Status: AC
  Filled 2019-09-21: qty 1

## 2019-09-21 MED ORDER — CHLORHEXIDINE GLUCONATE CLOTH 2 % EX PADS: 6.0000 | MEDICATED_PAD | Freq: Once | CUTANEOUS | Status: DC

## 2019-09-21 MED ORDER — FERROUS SULFATE 325 (65 FE) MG PO TABS
325.0000 mg | ORAL_TABLET | Freq: Every day | ORAL | Status: DC
  Administered 2019-09-21 – 2019-09-26 (×6): 325 mg via ORAL
  Filled 2019-09-21 (×6): qty 1

## 2019-09-21 MED ORDER — ROCURONIUM BROMIDE 10 MG/ML (PF) SYRINGE
PREFILLED_SYRINGE | INTRAVENOUS | Status: AC
  Filled 2019-09-21: qty 10

## 2019-09-21 MED ORDER — BIOTIN 10000 MCG PO TABS: 10000.0000 ug | ORAL_TABLET | Freq: Every day | ORAL | Status: DC

## 2019-09-21 MED ORDER — BACITRACIN ZINC 500 UNIT/GM EX OINT
TOPICAL_OINTMENT | CUTANEOUS | Status: DC | PRN
  Administered 2019-09-21: 1 via TOPICAL

## 2019-09-21 MED ORDER — SODIUM CHLORIDE 0.9% FLUSH: 3.0000 mL | INTRAVENOUS | Status: DC | PRN

## 2019-09-21 MED ORDER — SODIUM CHLORIDE 0.9% FLUSH: 3.0000 mL | Freq: Two times a day (BID) | INTRAVENOUS | Status: DC

## 2019-09-21 MED ORDER — PROPOFOL 10 MG/ML IV BOLUS
INTRAVENOUS | Status: AC
  Filled 2019-09-21: qty 20

## 2019-09-21 MED ORDER — PHENOL 1.4 % MT LIQD: 1.0000 | OROMUCOSAL | Status: DC | PRN

## 2019-09-21 MED ORDER — LIDOCAINE 2% (20 MG/ML) 5 ML SYRINGE
INTRAMUSCULAR | Status: AC
  Filled 2019-09-21: qty 5

## 2019-09-21 MED ORDER — MENTHOL 3 MG MT LOZG: 1.0000 | LOZENGE | OROMUCOSAL | Status: DC | PRN

## 2019-09-21 MED ORDER — MAGNESIUM CITRATE PO SOLN: 1.0000 | Freq: Once | ORAL | Status: DC | PRN

## 2019-09-21 MED ORDER — SENNOSIDES-DOCUSATE SODIUM 8.6-50 MG PO TABS
1.0000 | ORAL_TABLET | Freq: Every evening | ORAL | Status: DC | PRN
  Administered 2019-09-23: 1 via ORAL
  Filled 2019-09-21: qty 1

## 2019-09-21 MED ORDER — ONDANSETRON HCL 4 MG/2ML IJ SOLN
INTRAMUSCULAR | Status: AC
  Filled 2019-09-21: qty 2

## 2019-09-21 MED ORDER — OXYCODONE HCL 5 MG PO TABS: 10.0000 mg | ORAL_TABLET | ORAL | Status: DC | PRN

## 2019-09-21 MED ORDER — CALCIUM CARBONATE-VITAMIN D 500-200 MG-UNIT PO TABS
1.0000 | ORAL_TABLET | Freq: Every day | ORAL | Status: DC
  Administered 2019-09-21 – 2019-09-26 (×6): 1 via ORAL
  Filled 2019-09-21 (×6): qty 1

## 2019-09-21 MED ORDER — FENTANYL CITRATE (PF) 250 MCG/5ML IJ SOLN
INTRAMUSCULAR | Status: AC
  Filled 2019-09-21: qty 5

## 2019-09-21 MED ORDER — EPHEDRINE SULFATE-NACL 50-0.9 MG/10ML-% IV SOSY
PREFILLED_SYRINGE | INTRAVENOUS | Status: DC | PRN
  Administered 2019-09-21: 10 mg via INTRAVENOUS

## 2019-09-21 MED ORDER — HYDROCODONE-ACETAMINOPHEN 7.5-325 MG PO TABS
ORAL_TABLET | ORAL | Status: AC
  Filled 2019-09-21: qty 1

## 2019-09-21 MED ORDER — FENTANYL CITRATE (PF) 100 MCG/2ML IJ SOLN
INTRAMUSCULAR | Status: DC | PRN
Start: 2019-09-21 — End: 2019-09-21
  Administered 2019-09-21: 50 ug via INTRAVENOUS
  Administered 2019-09-21: 100 ug via INTRAVENOUS
  Administered 2019-09-21: 50 ug via INTRAVENOUS

## 2019-09-21 MED ORDER — CEFAZOLIN SODIUM-DEXTROSE 2-4 GM/100ML-% IV SOLN
INTRAVENOUS | Status: AC
  Filled 2019-09-21: qty 100

## 2019-09-21 MED ORDER — BACITRACIN ZINC 500 UNIT/GM EX OINT
TOPICAL_OINTMENT | CUTANEOUS | Status: AC
  Filled 2019-09-21: qty 28.35

## 2019-09-21 MED ORDER — CHLORHEXIDINE GLUCONATE 0.12 % MT SOLN: 15.0000 mL | Freq: Once | OROMUCOSAL | Status: AC

## 2019-09-21 MED ORDER — MORPHINE SULFATE (PF) 2 MG/ML IV SOLN: 1.0000 mg | INTRAVENOUS | Status: DC | PRN

## 2019-09-21 MED ORDER — FENTANYL CITRATE (PF) 100 MCG/2ML IJ SOLN: 25.0000 ug | INTRAMUSCULAR | Status: DC | PRN

## 2019-09-21 MED ORDER — EPHEDRINE 5 MG/ML INJ
INTRAVENOUS | Status: AC
  Filled 2019-09-21: qty 10

## 2019-09-21 MED ORDER — BUPIVACAINE HCL (PF) 0.5 % IJ SOLN
INTRAMUSCULAR | Status: AC
  Filled 2019-09-21: qty 30

## 2019-09-21 SURGICAL SUPPLY — 69 items
BASKET BONE COLLECTION (BASKET) ×3 IMPLANT
BENZOIN TINCTURE PRP APPL 2/3 (GAUZE/BANDAGES/DRESSINGS) IMPLANT
BIT DRILL PLIF MAS DISP 5.5MM (DRILL) ×1 IMPLANT
BLADE CLIPPER SURG (BLADE) IMPLANT
BUR MATCHSTICK NEURO 3.0 LAGG (BURR) ×3 IMPLANT
BUR PRECISION FLUTE 5.0 (BURR) ×3 IMPLANT
CAGE CONVEX CASCADIA 8.5X22X11 (Cage) ×6 IMPLANT
CANISTER SUCT 3000ML PPV (MISCELLANEOUS) ×3 IMPLANT
CARTRIDGE OIL MAESTRO DRILL (MISCELLANEOUS) ×1 IMPLANT
CLOSURE WOUND 1/2 X4 (GAUZE/BANDAGES/DRESSINGS)
CNTNR URN SCR LID CUP LEK RST (MISCELLANEOUS) ×1 IMPLANT
CONT SPEC 4OZ STRL OR WHT (MISCELLANEOUS) ×3
COVER BACK TABLE 60X90IN (DRAPES) ×3 IMPLANT
COVER WAND RF STERILE (DRAPES) ×3 IMPLANT
DECANTER SPIKE VIAL GLASS SM (MISCELLANEOUS) ×3 IMPLANT
DERMABOND ADVANCED (GAUZE/BANDAGES/DRESSINGS) ×2
DERMABOND ADVANCED .7 DNX12 (GAUZE/BANDAGES/DRESSINGS) ×1 IMPLANT
DIFFUSER DRILL AIR PNEUMATIC (MISCELLANEOUS) ×3 IMPLANT
DRAPE C-ARM 42X72 X-RAY (DRAPES) ×6 IMPLANT
DRAPE C-ARMOR (DRAPES) ×3 IMPLANT
DRAPE LAPAROTOMY 100X72X124 (DRAPES) ×3 IMPLANT
DRAPE SURG 17X23 STRL (DRAPES) ×3 IMPLANT
DRILL PLIF MAS DISP 5.5MM (DRILL) ×3
DRSG OPSITE POSTOP 4X8 (GAUZE/BANDAGES/DRESSINGS) ×3 IMPLANT
DURAPREP 26ML APPLICATOR (WOUND CARE) ×3 IMPLANT
ELECT REM PT RETURN 9FT ADLT (ELECTROSURGICAL) ×3
ELECTRODE REM PT RTRN 9FT ADLT (ELECTROSURGICAL) ×1 IMPLANT
GAUZE 4X4 16PLY RFD (DISPOSABLE) IMPLANT
GAUZE SPONGE 4X4 12PLY STRL (GAUZE/BANDAGES/DRESSINGS) IMPLANT
GLOVE BIO SURGEON STRL SZ8 (GLOVE) ×3 IMPLANT
GLOVE BIO SURGEON STRL SZ8.5 (GLOVE) ×3 IMPLANT
GLOVE ECLIPSE 6.5 STRL STRAW (GLOVE) ×6 IMPLANT
GLOVE EXAM NITRILE XL STR (GLOVE) IMPLANT
GOWN STRL REUS W/ TWL LRG LVL3 (GOWN DISPOSABLE) ×2 IMPLANT
GOWN STRL REUS W/ TWL XL LVL3 (GOWN DISPOSABLE) ×1 IMPLANT
GOWN STRL REUS W/TWL 2XL LVL3 (GOWN DISPOSABLE) ×3 IMPLANT
GOWN STRL REUS W/TWL LRG LVL3 (GOWN DISPOSABLE) ×6
GOWN STRL REUS W/TWL XL LVL3 (GOWN DISPOSABLE) ×3
GRAFT DURAGEN MATRIX 2WX2L ×3 IMPLANT
KIT BASIN OR (CUSTOM PROCEDURE TRAY) ×3 IMPLANT
KIT POSITION SURG JACKSON T1 (MISCELLANEOUS) ×3 IMPLANT
KIT TURNOVER KIT B (KITS) ×3 IMPLANT
MILL MEDIUM DISP (BLADE) ×3 IMPLANT
NEEDLE HYPO 25X1 1.5 SAFETY (NEEDLE) ×3 IMPLANT
NEEDLE SPNL 18GX3.5 QUINCKE PK (NEEDLE) IMPLANT
NS IRRIG 1000ML POUR BTL (IV SOLUTION) ×3 IMPLANT
OIL CARTRIDGE MAESTRO DRILL (MISCELLANEOUS) ×3
PACK LAMINECTOMY NEURO (CUSTOM PROCEDURE TRAY) ×3 IMPLANT
PAD ARMBOARD 7.5X6 YLW CONV (MISCELLANEOUS) ×6 IMPLANT
ROD POST ARMADA 5.5X55 (Rod) ×3 IMPLANT
ROD STRAIGHT 60MM (Rod) ×6 IMPLANT
SCREW LOCK (Screw) ×18 IMPLANT
SCREW LOCK FXNS SPNE MAS PL (Screw) ×6 IMPLANT
SCREW SHANK 5.5X40MM (Screw) ×3 IMPLANT
SCREW SHANKS 5.5X35 (Screw) ×3 IMPLANT
SCREW TULIP 5.5 (Screw) ×6 IMPLANT
SPONGE LAP 4X18 RFD (DISPOSABLE) IMPLANT
SPONGE SURGIFOAM ABS GEL 100 (HEMOSTASIS) ×3 IMPLANT
STRIP CLOSURE SKIN 1/2X4 (GAUZE/BANDAGES/DRESSINGS) IMPLANT
SUT ETHILON 3 0 FSL (SUTURE) ×3 IMPLANT
SUT PROLENE 6 0 BV (SUTURE) IMPLANT
SUT VIC AB 0 CT1 18XCR BRD8 (SUTURE) ×2 IMPLANT
SUT VIC AB 0 CT1 8-18 (SUTURE) ×6
SUT VIC AB 2-0 CT1 18 (SUTURE) ×3 IMPLANT
SUT VIC AB 3-0 SH 8-18 (SUTURE) ×3 IMPLANT
TOWEL GREEN STERILE (TOWEL DISPOSABLE) ×3 IMPLANT
TOWEL GREEN STERILE FF (TOWEL DISPOSABLE) ×3 IMPLANT
TRAY FOLEY MTR SLVR 16FR STAT (SET/KITS/TRAYS/PACK) ×3 IMPLANT
WATER STERILE IRR 1000ML POUR (IV SOLUTION) ×3 IMPLANT

## 2019-09-21 NOTE — H&P (Signed)
There were no vitals taken for this visit.    Angela Davidson comes in today complaining of pain in the lower back, buttocks, and into both lower extremities.  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  She has full strength in the lower extremities today.  2+ reflexes knees, right ankle; trace to 1+ at the left ankle.  Gait is mildly antalgic, slow and steady is the best way to characterize it.  She is alert, oriented x4, answers all questions appropriately.  She weighs 150 pounds.   She has had the pain for about 2 months. Allergies  Allergen Reactions  . Morphine And Related Nausea Only    Headaches  . Other     VICRYL Suture/surgical thread (blisters)  . Codeine Nausea Only  . Hydrocodone Nausea Only   Past Medical History:  Diagnosis Date  . Anemia   . Asthma   . Depression   . H/O gastroesophageal reflux (GERD)    no longer after gastric bypass   ' Past Surgical History:  Procedure Laterality Date  . ABDOMINAL HYSTERECTOMY  1979  . BACK SURGERY     lumbar -2007 , cervical fusion 1989  . CHOLECYSTECTOMY  1974  . EYE SURGERY Bilateral 2001   cataract surgery with lens implant  . HAMMER TOE SURGERY Left 2012  . HAND TENDON SURGERY Right 1994  . NASAL SINUS SURGERY  2009 and 1992  . ROUX-EN-Y PROCEDURE  2000  . TONSILLECTOMY     Family History  Problem Relation Age of Onset  . COPD Mother   . Diabetes Mother   . Renal Disease Mother   . Heart disease Mother   . Emphysema Father    Social History   Tobacco Use  . Smoking status: Former Smoker    Types: Cigarettes    Quit date: 02/19/1988    Years since quitting: 31.6  . Smokeless tobacco: Never Used  Vaping Use  . Vaping Use: Never used  Substance Use Topics  . Alcohol use: No  . Drug use: No   Angela Davidson returns today with an MRI of the lumbar spine. She's last been operated on February 27, 2015, at L4-5, which she was listhesed and underwent a posterior  lumbar interbody arthrodesis.  MRI today, from July 30th, shows adjacent segment disease, stenosis, facet arthropathy at the L3-4 level.  Some listhesis is present there also.  She appears to have a good solid arthrodesis of the lumbar spine.  I think she needs a decompression and another PLIF at 3-4.  She is alert, oriented by 4, has full strength in the lower extremities and upper extremities.  Gait mildly antalgic.  Weighs 148 pounds.  Temperature is 97.8, blood pressure 145/85, pulse 65.  Pain is 4/10

## 2019-09-21 NOTE — Progress Notes (Signed)
Pt admitted to the unit from pacu; pt alert and verbally responsive. Pt oriented to the unit and room, per report pt to remain flat in bed till tomorrow 1600. Foley remains intact and unclamped and to remain in till off bedrest per report and order. Pt back incision has HC dsg which has scant blood stain but remains intact, pt right forearm has Vaseline gauze with tergaderm intact skin. IV tact and infusing, VSS, left has a scratch open to air, scd's on, bed alarm on, call light within reach. Will continue to closely monitor. Francis Gaines Mishelle Hassan RN   09/21/19 2225  Vitals  Temp 99.3 F (37.4 C)  Temp Source Oral  BP (!) 149/75  MAP (mmHg) 97  BP Location Right Arm  BP Method Automatic  Patient Position (if appropriate) Lying  Pulse Rate 74  Pulse Rate Source Monitor  Resp 17  Level of Consciousness  Level of Consciousness Alert  Oxygen Therapy  SpO2 97 %  O2 Device Room Air

## 2019-09-21 NOTE — Anesthesia Procedure Notes (Signed)
Procedure Name: Intubation Date/Time: 09/21/2019 11:31 AM Performed by: Renato Shin, CRNA Pre-anesthesia Checklist: Patient identified, Emergency Drugs available, Suction available and Patient being monitored Patient Re-evaluated:Patient Re-evaluated prior to induction Oxygen Delivery Method: Circle system utilized Preoxygenation: Pre-oxygenation with 100% oxygen Induction Type: IV induction Ventilation: Mask ventilation without difficulty Laryngoscope Size: Coopersmith and 2 Grade View: Grade I Tube type: Oral Tube size: 7.0 mm Number of attempts: 1 Airway Equipment and Method: Stylet and Oral airway Placement Confirmation: ETT inserted through vocal cords under direct vision,  positive ETCO2 and breath sounds checked- equal and bilateral Secured at: 21 cm Tube secured with: Tape Dental Injury: Teeth and Oropharynx as per pre-operative assessment

## 2019-09-21 NOTE — Anesthesia Preprocedure Evaluation (Addendum)
Anesthesia Evaluation  Patient identified by MRN, date of birth, ID band Patient awake    Reviewed: Allergy & Precautions, NPO status , Patient's Chart, lab work & pertinent test results  History of Anesthesia Complications Negative for: history of anesthetic complications  Airway Mallampati: II  TM Distance: >3 FB Neck ROM: Full    Dental  (+) Partial Upper, Lower Dentures, Dental Advisory Given,    Pulmonary neg pulmonary ROS, neg recent URI, former smoker,  Covid-19 Nucleic Acid Test Results Lab Results      Component                Value               Date                      Peak              NEGATIVE            09/18/2019              breath sounds clear to auscultation       Cardiovascular negative cardio ROS   Rhythm:Regular     Neuro/Psych PSYCHIATRIC DISORDERS Anxiety Depression negative neurological ROS     GI/Hepatic negative GI ROS, Neg liver ROS,   Endo/Other  negative endocrine ROS  Renal/GU negative Renal ROSLab Results      Component                Value               Date                      CREATININE               0.65                09/14/2019                Musculoskeletal  (+) Arthritis ,   Abdominal   Peds  Hematology  (+) Blood dyscrasia, anemia , Lab Results      Component                Value               Date                      WBC                      7.8                 09/14/2019                HGB                      13.5                09/14/2019                HCT                      43.1                09/14/2019                MCV  91.3                09/14/2019                PLT                      284                 09/14/2019              Anesthesia Other Findings   Reproductive/Obstetrics                            Anesthesia Physical Anesthesia Plan  ASA: II  Anesthesia Plan: General   Post-op Pain  Management:    Induction: Intravenous  PONV Risk Score and Plan: 3 and Ondansetron and Dexamethasone  Airway Management Planned: Oral ETT  Additional Equipment: None  Intra-op Plan:   Post-operative Plan: Extubation in OR  Informed Consent: I have reviewed the patients History and Physical, chart, labs and discussed the procedure including the risks, benefits and alternatives for the proposed anesthesia with the patient or authorized representative who has indicated his/her understanding and acceptance.     Dental advisory given  Plan Discussed with: CRNA and Surgeon  Anesthesia Plan Comments:         Anesthesia Quick Evaluation

## 2019-09-21 NOTE — Transfer of Care (Signed)
Immediate Anesthesia Transfer of Care Note  Patient: Angela Davidson  Procedure(s) Performed: Lumbar three-four Posterior lumbar interbody fusion (N/A )  Patient Location: PACU  Anesthesia Type:General  Level of Consciousness: awake, alert  and oriented  Airway & Oxygen Therapy: Patient Spontanous Breathing  Post-op Assessment: Report given to RN and Patient moving all extremities X 4  Post vital signs: Reviewed and stable  Last Vitals:  Vitals Value Taken Time  BP 113/66 09/21/19 1605  Temp    Pulse 97 09/21/19 1606  Resp 14 09/21/19 1606  SpO2 92 % 09/21/19 1606  Vitals shown include unvalidated device data.  Last Pain:  Vitals:   09/21/19 1018  TempSrc: Tympanic  PainSc:          Complications: No complications documented.

## 2019-09-22 ENCOUNTER — Other Ambulatory Visit: Payer: Self-pay

## 2019-09-22 MED ORDER — PROMETHAZINE HCL 25 MG PO TABS
12.5000 mg | ORAL_TABLET | Freq: Four times a day (QID) | ORAL | Status: DC | PRN
  Administered 2019-09-22: 12.5 mg via ORAL
  Filled 2019-09-22: qty 1

## 2019-09-22 MED ORDER — SODIUM CHLORIDE 0.9 % IV SOLN: INTRAVENOUS | Status: DC

## 2019-09-22 MED ORDER — BUTALBITAL-APAP-CAFFEINE 50-325-40 MG PO TABS
1.0000 | ORAL_TABLET | Freq: Four times a day (QID) | ORAL | Status: DC | PRN
  Administered 2019-09-22 – 2019-09-27 (×11): 1 via ORAL
  Filled 2019-09-22 (×11): qty 1

## 2019-09-22 NOTE — Progress Notes (Signed)
Patient continues with headache and nausea; MD paged; continue bedrest and may elevate head to 15 degrees.

## 2019-09-22 NOTE — Progress Notes (Signed)
   Providing Compassionate, Quality Care - Together   Subjective: Patient reports significant headache not responding to hydrocodone/acetaminophen and nausea not responding to Zofran. Patient remains flat.  Objective: Vital signs in last 24 hours: Temp:  [97.7 F (36.5 C)-99.6 F (37.6 C)] 99.6 F (37.6 C) (09/04 0803) Pulse Rate:  [68-87] 85 (09/04 0803) Resp:  [9-20] 20 (09/04 0803) BP: (108-157)/(55-86) 115/61 (09/04 0803) SpO2:  [92 %-98 %] 92 % (09/04 0803)  Intake/Output from previous day: 09/03 0701 - 09/04 0700 In: 3023.4 [P.O.:240; I.V.:2433.4; IV Piggyback:350] Out: 7681 [Urine:795; Blood:400] Intake/Output this shift: No intake/output data recorded.  Alert and oriented x 4 PERRLA MAE, Strength and sensation grossly intact Incision is covered with Honeycomb dressing and Steri Strips; Dressing is clean, dry, and intact   Lab Results: No results for input(s): WBC, HGB, HCT, PLT in the last 72 hours. BMET No results for input(s): NA, K, CL, CO2, GLUCOSE, BUN, CREATININE, CALCIUM in the last 72 hours.  Studies/Results: DG Lumbar Spine 2-3 Views  Result Date: 09/21/2019 CLINICAL DATA:  Surgery, elective. Additional history provided: Lumbar 3-4 posterior lumbar interbody fusion. Provided fluoroscopy time 46.3 seconds, 28.81 mGy. EXAM: LUMBAR SPINE - 2-3 VIEW; DG C-ARM 1-60 MIN COMPARISON:  Radiographs of the lumbar spine 08/27/2019. MRI of the lumbar spine 08/17/2019. FINDINGS: PA and lateral view intraoperative fluoroscopic images of the lumbar spine are submitted, 8 images total. On the final image, there are new bilateral pedicle screws within the L3 vertebral body as well as a new L3-L4 interbody spacer. Redemonstrated bilateral pedicle screws within the L4 and L5 vertebrae. Vertical interconnecting rods are not present at the time of the most recent images. IMPRESSION: Eight intraoperative fluoroscopic images of the lumbar spine as described. Electronically Signed   By:  Kellie Simmering DO   On: 09/21/2019 16:26   DG C-Arm 1-60 Min  Result Date: 09/21/2019 CLINICAL DATA:  Surgery, elective. Additional history provided: Lumbar 3-4 posterior lumbar interbody fusion. Provided fluoroscopy time 46.3 seconds, 28.81 mGy. EXAM: LUMBAR SPINE - 2-3 VIEW; DG C-ARM 1-60 MIN COMPARISON:  Radiographs of the lumbar spine 08/27/2019. MRI of the lumbar spine 08/17/2019. FINDINGS: PA and lateral view intraoperative fluoroscopic images of the lumbar spine are submitted, 8 images total. On the final image, there are new bilateral pedicle screws within the L3 vertebral body as well as a new L3-L4 interbody spacer. Redemonstrated bilateral pedicle screws within the L4 and L5 vertebrae. Vertical interconnecting rods are not present at the time of the most recent images. IMPRESSION: Eight intraoperative fluoroscopic images of the lumbar spine as described. Electronically Signed   By: Kellie Simmering DO   On: 09/21/2019 16:26    Assessment/Plan: Patient is one day status post L3-4 PLIF by Dr. Christella Noa. She has been kept flat postoperatively for possible CSF leak. She continues to complain of headache. There is no drainage from her surgical wound.   LOS: 1 day    -Added promethazine for nausea not responding to Zofran -Will trial Fioricet for headache -Continue to keep patient flat at this time   Viona Gilmore, DNP, AGNP-C Nurse Practitioner  [facility] [place]. [address] [place] [address], [place], [place] [address] P: [phone]    F: [phone]  09/22/2019, 10:56 AM

## 2019-09-22 NOTE — Progress Notes (Signed)
OT Cancellation Note  Patient Details Name: Angela Davidson MRN: [id number] DOB: [birth year]   Cancelled Treatment:    Reason Eval/Treat Not Completed: Active bedrest order. Pt currently on bedrest. Will await increased activity orders prior to initiating OT eval.   Joeseph Amor 09/22/2019, 8:13 AM

## 2019-09-22 NOTE — Progress Notes (Signed)
PT Cancellation Note  Patient Details Name: Angela Davidson MRN: [id number] DOB: Jun 28, 1940   Cancelled Treatment:    Reason Eval/Treat Not Completed: Pt currently on bedrest. Will await increased activity orders prior to initiating PT eval.    Thelma Comp 09/22/2019, 7:46 AM   Rolinda Roan, PT, DPT Acute Rehabilitation Services Pager: [phone] Office: [phone]

## 2019-09-23 MED ORDER — PROCHLORPERAZINE 25 MG RE SUPP
25.0000 mg | Freq: Two times a day (BID) | RECTAL | Status: DC | PRN
  Administered 2019-09-23: 25 mg via RECTAL
  Filled 2019-09-23 (×2): qty 1

## 2019-09-23 MED ORDER — BISACODYL 10 MG RE SUPP
10.0000 mg | Freq: Every day | RECTAL | Status: DC | PRN
  Administered 2019-09-23: 10 mg via RECTAL
  Filled 2019-09-23: qty 1

## 2019-09-23 NOTE — Progress Notes (Signed)
   Providing Compassionate, Quality Care - Together  NEUROSURGERY PROGRESS NOTE   S: No issues overnight. HA this am, improving though   O: EXAM:  BP 126/66 (BP Location: Right Arm)   Pulse 77   Temp 98 F (36.7 C) (Oral)   Resp 18   Ht 5' 0.5" (1.537 m)   Wt 68 kg   SpO2 93%   BMI 28.81 kg/m   Awake, alert, oriented  Speech fluent, appropriate  CNs grossly intact  5/5 BUE/BLE  Incision c/d/i, no s/s leaking  ASSESSMENT:  79 y.o. female with  S/p L3-4 PLIF POD#2  PLAN: - HOB flat for 24 more hours, will get up tomorrow - HA improving w fioricet - pt/ot tomorrow - pain control     Thank you for allowing me to participate in this patient's care.  Please do not hesitate to call with questions or concerns.   Elwin Sleight, [facility] Cell: [phone]

## 2019-09-23 NOTE — Progress Notes (Signed)
OT Cancellation Note  Patient Details Name: Angela Davidson MRN: [id number] DOB: [birth year]   Cancelled Treatment:    Reason Eval/Treat Not Completed: Active bedrest order.  Will  Reattempt.  Nilsa Nutting., OTR/L Acute Rehabilitation Services Pager [phone] Office [phone]   Lucille Passy M 09/23/2019, 7:50 AM

## 2019-09-23 NOTE — Progress Notes (Signed)
PT Cancellation Note  Patient Details Name: YALONDA SAMPLE MRN: [id number] DOB: August 23, 1940   Cancelled Treatment:    Reason Eval/Treat Not Completed: Active bedrest order;Other (comment)  Noted bedrest order and PT Consult order start time indicated is 9/5 1633. Will monitor for updates after MD sees this a.m.   Arby Barrette, PT Pager [phone]   [name] 09/23/2019, 8:18 AM

## 2019-09-24 NOTE — Plan of Care (Signed)
Pt alert and oriented. No c/o nausea. Incision intact with old drainage.  Problem: Education: Goal: Knowledge of General Education information will improve Description: Including pain rating scale, medication(s)/side effects and non-pharmacologic comfort measures Outcome: Progressing   Problem: Health Behavior/Discharge Planning: Goal: Ability to manage health-related needs will improve Outcome: Progressing   Problem: Clinical Measurements: Goal: Ability to maintain clinical measurements within normal limits will improve Outcome: Progressing Goal: Will remain free from infection Outcome: Progressing Goal: Diagnostic test results will improve Outcome: Progressing Goal: Respiratory complications will improve Outcome: Progressing Goal: Cardiovascular complication will be avoided Outcome: Progressing   Problem: Activity: Goal: Risk for activity intolerance will decrease Outcome: Progressing   Problem: Nutrition: Goal: Adequate nutrition will be maintained Outcome: Progressing   Problem: Coping: Goal: Level of anxiety will decrease Outcome: Progressing   Problem: Elimination: Goal: Will not experience complications related to bowel motility Outcome: Progressing Goal: Will not experience complications related to urinary retention Outcome: Progressing   Problem: Pain Managment: Goal: General experience of comfort will improve Outcome: Progressing   Problem: Safety: Goal: Ability to remain free from injury will improve Outcome: Progressing   Problem: Skin Integrity: Goal: Risk for impaired skin integrity will decrease Outcome: Progressing   Problem: Education: Goal: Ability to verbalize activity precautions or restrictions will improve Outcome: Progressing Goal: Knowledge of the prescribed therapeutic regimen will improve Outcome: Progressing Goal: Understanding of discharge needs will improve Outcome: Progressing   Problem: Activity: Goal: Ability to avoid  complications of mobility impairment will improve Outcome: Progressing Goal: Ability to tolerate increased activity will improve Outcome: Progressing Goal: Will remain free from falls Outcome: Progressing   Problem: Bowel/Gastric: Goal: Gastrointestinal status for postoperative course will improve Outcome: Progressing   Problem: Clinical Measurements: Goal: Ability to maintain clinical measurements within normal limits will improve Outcome: Progressing Goal: Postoperative complications will be avoided or minimized Outcome: Progressing Goal: Diagnostic test results will improve Outcome: Progressing   Problem: Pain Management: Goal: Pain level will decrease Outcome: Progressing   Problem: Skin Integrity: Goal: Will show signs of wound healing Outcome: Progressing   Problem: Health Behavior/Discharge Planning: Goal: Identification of resources available to assist in meeting health care needs will improve Outcome: Progressing   Problem: Bladder/Genitourinary: Goal: Urinary functional status for postoperative course will improve Outcome: Progressing

## 2019-09-24 NOTE — Evaluation (Signed)
Occupational Therapy Evaluation Patient Details Name: Angela Davidson MRN: [id number] DOB: 10/23/1940 Today's Date: 09/24/2019    History of Present Illness 79 y.o. female admitted on 09/21/19 for L3-4 PLIF.  Pt on bedrest from day of surgery until 09/24/19.  Pt with significant PMH of anemia, back and neck surgery (2007, 1989).     Clinical Impression   PTA patient independent. Admitted for above and limited by problem list below, including impaired balance, back precautions. Patient currently requires min guard for transfers and limited in room mobility using RW, min guard for LB ADLs and grooming at sink.  Patient able to verbalize 3/3 back precautions and adhere to them functionally with supervision. Reviewed ADL compensatory techniques and recommendations.  Believe she will benefit from further OT services acutely to optimize independence and safety with return to home alone, but anticipate no further needs after dc home. Will follow.     Follow Up Recommendations  No OT follow up    Equipment Recommendations  None recommended by OT    Recommendations for Other Services       Precautions / Restrictions Precautions Precautions: Back Precaution Booklet Issued: Yes (comment) Precaution Comments: pt with good recall of precautions  Required Braces or Orthoses:  (no brace needed order ) Restrictions Weight Bearing Restrictions: No      Mobility Bed Mobility Overal bed mobility: Needs Assistance Bed Mobility: Sidelying to Sit   Sidelying to sit: Min guard       General bed mobility comments: OOB upon entry in recliner   Transfers Overall transfer level: Needs assistance Equipment used: Rolling walker (2 wheeled) Transfers: Sit to/from Stand Sit to Stand: Min guard         General transfer comment: min guard for safety and balance, cueing for hand placement/ posture     Balance Overall balance assessment: Needs assistance Sitting-balance support: Feet supported;No upper  extremity supported Sitting balance-Leahy Scale: Good Sitting balance - Comments: during LB ADL engagement    Standing balance support: Bilateral upper extremity supported;No upper extremity supported;During functional activity Standing balance-Leahy Scale: Fair Standing balance comment: patient able to groom statically without UE support and min guard but relies on BUE support dynamically                            ADL either performed or assessed with clinical judgement   ADL Overall ADL's : Needs assistance/impaired     Grooming: Min guard;Standing   Upper Body Bathing: Set up;Sitting   Lower Body Bathing: Min guard;Sit to/from stand   Upper Body Dressing : Set up;Sitting   Lower Body Dressing: Min guard;Sit to/from stand Lower Body Dressing Details (indicate cue type and reason): figure 4 technique to reach B LEs, plans to use reacher/slip on shoes at home Toilet Transfer: Min guard;RW;Ambulation           Functional mobility during ADLs: Min guard;Rolling walker;Cueing for safety General ADL Comments: pt limited by pain, decreased activity tolerance, back precautions-- reviewed back precautions and ADL Compensatory techniques      Vision         Perception     Praxis      Pertinent Vitals/Pain Pain Assessment: Faces Faces Pain Scale: Hurts little more Pain Location: incisional and R hip Pain Descriptors / Indicators: Aching;Burning Pain Intervention(s): Limited activity within patient's tolerance;Monitored during session;Repositioned     Hand Dominance Right   Extremity/Trunk Assessment Upper Extremity Assessment Upper Extremity Assessment:  Generalized weakness   Lower Extremity Assessment Lower Extremity Assessment: Defer to PT evaluation   Cervical / Trunk Assessment Cervical / Trunk Assessment: Other exceptions Cervical / Trunk Exceptions: pt with h/o cervical fusion and previous back surgery   Communication  Communication Communication: No difficulties   Cognition Arousal/Alertness: Awake/alert Behavior During Therapy: WFL for tasks assessed/performed Overall Cognitive Status: Within Functional Limits for tasks assessed                                     General Comments  pt reports having support from son and church friends at [employer] as needed     Exercises     Shoulder Instructions      Home Living Family/patient expects to be discharged to:: Private residence Living Arrangements: Alone Available Help at Discharge: Family;Available PRN/intermittently (brother and sister in law live in [place], has a son who works) Type of Home: House Home Access: [place]: One level;Other (Comment) (steps to den with chairlift and rail)     Bathroom Shower/Tub: Other (comment) ("walk in tub" )   Bathroom Toilet: Handicapped height     Home Equipment: Environmental consultant - 2 wheels;Adaptive equipment;Shower seat - built in;Grab bars - toilet;Grab bars - tub/shower;Hand held shower head Adaptive Equipment: Reacher;Sock aid        Prior Functioning/Environment Level of Independence: Independent        Comments: Pt is very independent has outdoor cats and a little dog, enjoys painting.         OT Problem List: Decreased activity tolerance;Impaired balance (sitting and/or standing);Decreased knowledge of use of DME or AE;Pain      OT Treatment/Interventions: Self-care/ADL training;DME and/or AE instruction;Therapeutic activities;Patient/family education;Balance training    OT Goals(Current goals can be found in the care plan section) Acute Rehab OT Goals Patient Stated Goal: to get back home to her independence OT Goal Formulation: With patient Time For Goal Achievement: 10/08/19 Potential to Achieve Goals: Good  OT Frequency: Min 2X/week   Barriers to D/C:            Co-evaluation              AM-PAC OT "6 Clicks" Daily Activity     Outcome Measure  Help from another person eating meals?: None Help from another person taking care of personal grooming?: A Little Help from another person toileting, which includes using toliet, bedpan, or urinal?: A Little Help from another person bathing (including washing, rinsing, drying)?: A Little Help from another person to put on and taking off regular upper body clothing?: A Little Help from another person to put on and taking off regular lower body clothing?: A Little 6 Click Score: 19   End of Session Equipment Utilized During Treatment: Rolling walker Nurse Communication: Mobility status  Activity Tolerance: Patient tolerated treatment well Patient left: in chair;with call bell/phone within reach;with chair alarm set  OT Visit Diagnosis: Other abnormalities of gait and mobility (R26.89);Pain Pain - part of body:  (back-incisonal)                Time: 2878-6767 OT Time Calculation (min): 20 min Charges:  OT General Charges $OT Visit: 1 Visit OT Evaluation $OT Eval Moderate Complexity: 1 Mod  Jolaine Artist, OT Acute Rehabilitation Services Pager [phone] Office [phone]   Delight Stare 09/24/2019, 1:15 PM

## 2019-09-24 NOTE — Progress Notes (Signed)
   Providing Compassionate, Quality Care - Together  NEUROSURGERY PROGRESS NOTE   S: No issues overnight. HA stable, no drainage from incision  O: EXAM:  BP 139/66 (BP Location: Left Arm)   Pulse 83   Temp 99.5 F (37.5 C) (Oral)   Resp 18   Ht 5' 0.5" (1.537 m)   Wt 68 kg   SpO2 94%   BMI 28.81 kg/m   Awake, alert, oriented  Speech fluent, appropriate  Cns grossly intact  5/5 BUE/BLE  Incision c/d/i  ASSESSMENT:  79 y.o. female with  S/p L3-4 PLIF POD#3  PLAN: - dc foley - OOB today - HA improving w fioricet - pt/ot  - pain control     Thank you for allowing me to participate in this patient's care.  Please do not hesitate to call with questions or concerns.   Elwin Sleight, [facility] Cell: [phone]

## 2019-09-24 NOTE — Progress Notes (Signed)
Pt foley removed as verbally ordered by MD. 456ml urine, clear and yellow color emptied from bag. MD ordered for pt to come off bedrest and OOB. HOB elevated to 40 degrees gradually, pt OOB to dangle to side of bed. Pt tolerated well. Pt back to bed with call light within reach. Will continue to closely monitor. Francis Gaines Elinda Bunten RN.

## 2019-09-24 NOTE — Evaluation (Signed)
Physical Therapy Evaluation Patient Details Name: Angela Davidson MRN: [id number] DOB: 12/12/1940 Today's Date: 09/24/2019   History of Present Illness  79 y.o. female admitted on 09/21/19 for L3-4 PLIF.  Pt on bedrest from day of surgery until 09/24/19.  Pt with significant PMH of anemia, back and neck surgery (2007, 1989).    Clinical Impression  Pt is POD #3 and is now off of bedrest.  She mobilized around the room (did not venture to the hallway due to reports of lightheadedness) with min guard assist and RW. She is familiar with post op recovery from multiple past spine surgeries and is confident that she can do it without someone staying with her at home.  Pt will need to progress to hallway gait and practice 2 steps to access her den prior to d/c.   PT to follow acutely for deficits listed below.      Follow Up Recommendations No PT follow up    Equipment Recommendations  None recommended by PT    Recommendations for Other Services       Precautions / Restrictions Precautions Precautions: Back Precaution Booklet Issued: No Precaution Comments: verbally reviewed precautions Required Braces or Orthoses:  (none )      Mobility  Bed Mobility Overal bed mobility: Needs Assistance Bed Mobility: Sidelying to Sit   Sidelying to sit: Min guard       General bed mobility comments: min guard assist for safety.  Cues for back precautions Already on her side at rest, so we did not practice log roll, only verbally reviewed.   Transfers Overall transfer level: Needs assistance Equipment used: Rolling walker (2 wheeled) Transfers: Sit to/from Stand Sit to Stand: Min guard         General transfer comment: Min guard assist for safety during transitions, cues to stand a moment to assess for lightheadedness (pt reported lightheadedness, but only mild).   Ambulation/Gait Ambulation/Gait assistance: Min guard Gait Distance (Feet): 20 Feet Assistive device: Rolling walker (2  wheeled) Gait Pattern/deviations: Step-through pattern;Shuffle;Antalgic Gait velocity: decreased Gait velocity interpretation: 1.31 - 2.62 ft/sec, indicative of limited community ambulator General Gait Details: a bit antalgic favoring her right painful hip and leg pt reports these symptoms were present prior to surgery and we discussed healing times, etc.  She self cued for upright posture and proximity to RW.   Stairs            Wheelchair Mobility    Modified Rankin (Stroke Patients Only)       Balance Overall balance assessment: Needs assistance Sitting-balance support: Feet supported;No upper extremity supported Sitting balance-Leahy Scale: Fair Sitting balance - Comments: not challenged EOB   Standing balance support: Bilateral upper extremity supported;Single extremity supported Standing balance-Leahy Scale: Poor Standing balance comment: not challenged in standing. had at least one UE supported on RW at all times.                              Pertinent Vitals/Pain Pain Assessment: Faces Faces Pain Scale: Hurts little more Pain Location: incisional and R hip Pain Descriptors / Indicators: Aching;Burning Pain Intervention(s): Limited activity within patient's tolerance;Monitored during session;Repositioned    Home Living Family/patient expects to be discharged to:: Private residence Living Arrangements: Alone Available Help at Discharge: Family;Available PRN/intermittently (brother and sister in law live in [place], has a son who works) Type of Home: House Home Access: [place]: One  level;Other (Comment) (steps to den with chairlift and rail) Home Equipment: Environmental consultant - 2 wheels;Adaptive equipment      Prior Function Level of Independence: Independent         Comments: Pt is very independent has outdoor cats and a little dog, enjoys painting.      Hand Dominance   Dominant Hand: Right    Extremity/Trunk Assessment    Upper Extremity Assessment Upper Extremity Assessment: Defer to OT evaluation    Lower Extremity Assessment Lower Extremity Assessment: Generalized weakness    Cervical / Trunk Assessment Cervical / Trunk Assessment: Other exceptions Cervical / Trunk Exceptions: pt with h/o cervical fusion and previous back surgery  Communication   Communication: No difficulties  Cognition Arousal/Alertness: Awake/alert Behavior During Therapy: WFL for tasks assessed/performed Overall Cognitive Status: Within Functional Limits for tasks assessed                                        General Comments      Exercises     Assessment/Plan    PT Assessment Patient needs continued PT services  PT Problem List Decreased strength;Decreased activity tolerance;Decreased balance;Decreased mobility;Decreased knowledge of use of DME;Decreased knowledge of precautions;Pain       PT Treatment Interventions DME instruction;Gait training;Stair training;Functional mobility training;Therapeutic activities;Balance training;Therapeutic exercise;Patient/family education;Modalities    PT Goals (Current goals can be found in the Care Plan section)  Acute Rehab PT Goals Patient Stated Goal: to get back home to her independence PT Goal Formulation: With patient Time For Goal Achievement: 10/08/19 Potential to Achieve Goals: Good    Frequency Min 5X/week   Barriers to discharge        Co-evaluation               AM-PAC PT "6 Clicks" Mobility  Outcome Measure Help needed turning from your back to your side while in a flat bed without using bedrails?: A Little Help needed moving from lying on your back to sitting on the side of a flat bed without using bedrails?: A Little Help needed moving to and from a bed to a chair (including a wheelchair)?: A Little Help needed standing up from a chair using your arms (e.g., wheelchair or bedside chair)?: A Little Help needed to walk in hospital  room?: A Little Help needed climbing 3-5 steps with a railing? : A Little 6 Click Score: 18    End of Session   Activity Tolerance: Patient limited by pain Patient left: in chair;with call bell/phone within reach;Other (comment) (with OT entering room for assessment. )   PT Visit Diagnosis: Muscle weakness (generalized) (M62.81);Difficulty in walking, not elsewhere classified (R26.2);Other symptoms and signs involving the nervous system (U23.536)    Time: 1443-1540 PT Time Calculation (min) (ACUTE ONLY): 17 min   Charges:   PT Evaluation $PT Eval Moderate Complexity: [place], PT, DPT  Acute Rehabilitation [phone] pager [phone]) [phone] office

## 2019-09-25 NOTE — Plan of Care (Signed)
Pt is alert and oriented. Pt has tremors when sitting on the side of the bed to all extremities. Pt is on RA. HA controlled with PRN meds.   Problem: Education: Goal: Knowledge of General Education information will improve Description: Including pain rating scale, medication(s)/side effects and non-pharmacologic comfort measures Outcome: Progressing   Problem: Health Behavior/Discharge Planning: Goal: Ability to manage health-related needs will improve Outcome: Progressing   Problem: Clinical Measurements: Goal: Ability to maintain clinical measurements within normal limits will improve Outcome: Progressing Goal: Will remain free from infection Outcome: Progressing Goal: Diagnostic test results will improve Outcome: Progressing Goal: Respiratory complications will improve Outcome: Progressing Goal: Cardiovascular complication will be avoided Outcome: Progressing   Problem: Activity: Goal: Risk for activity intolerance will decrease Outcome: Progressing   Problem: Nutrition: Goal: Adequate nutrition will be maintained Outcome: Progressing   Problem: Coping: Goal: Level of anxiety will decrease Outcome: Progressing   Problem: Elimination: Goal: Will not experience complications related to bowel motility Outcome: Progressing Goal: Will not experience complications related to urinary retention Outcome: Progressing   Problem: Pain Managment: Goal: General experience of comfort will improve Outcome: Progressing   Problem: Safety: Goal: Ability to remain free from injury will improve Outcome: Progressing   Problem: Skin Integrity: Goal: Risk for impaired skin integrity will decrease Outcome: Progressing   Problem: Education: Goal: Ability to verbalize activity precautions or restrictions will improve Outcome: Progressing Goal: Knowledge of the prescribed therapeutic regimen will improve Outcome: Progressing Goal: Understanding of discharge needs will  improve Outcome: Progressing   Problem: Activity: Goal: Ability to avoid complications of mobility impairment will improve Outcome: Progressing Goal: Ability to tolerate increased activity will improve Outcome: Progressing Goal: Will remain free from falls Outcome: Progressing   Problem: Bowel/Gastric: Goal: Gastrointestinal status for postoperative course will improve Outcome: Progressing   Problem: Clinical Measurements: Goal: Ability to maintain clinical measurements within normal limits will improve Outcome: Progressing Goal: Postoperative complications will be avoided or minimized Outcome: Progressing Goal: Diagnostic test results will improve Outcome: Progressing   Problem: Pain Management: Goal: Pain level will decrease Outcome: Progressing   Problem: Skin Integrity: Goal: Will show signs of wound healing Outcome: Progressing   Problem: Health Behavior/Discharge Planning: Goal: Identification of resources available to assist in meeting health care needs will improve Outcome: Progressing   Problem: Bladder/Genitourinary: Goal: Urinary functional status for postoperative course will improve Outcome: Progressing

## 2019-09-25 NOTE — Progress Notes (Signed)
Occupational Therapy Treatment Patient Details Name: Angela Davidson MRN: [id number] DOB: [birth year] Today's Date: 09/25/2019    History of present illness 79 y.o. female admitted on 09/21/19 for L3-4 PLIF.  Pt on bedrest from day of surgery until 09/24/19.  Pt with significant PMH of anemia, back and neck surgery (2007, 1989).     OT comments  Patient continues to make steady progress towards goals in skilled OT session. Patient's session encompassed functional ADLs in order to increased overall activity tolerance and increase independence. Pt continues to demonstrate solid adherence to back precautions and able to verbalize all precautions without cues. Pt limited currently by headache in L frontal area (pre-medicated before session) but motivated to return home to prior level of independence. Discharge remains appropriate at this time; will continue to follow acutely.    Follow Up Recommendations  No OT follow up    Equipment Recommendations  None recommended by OT    Recommendations for Other Services      Precautions / Restrictions Precautions Precautions: Back Precaution Booklet Issued: Yes (comment) Precaution Comments: pt with good recall of precautions; no cues needed for adhering Required Braces or Orthoses:  (no brace needed order ) Restrictions Weight Bearing Restrictions: No       Mobility Bed Mobility Overal bed mobility: Needs Assistance Bed Mobility: Sidelying to Sit   Sidelying to sit: Min guard       General bed mobility comments: pt up with OT on arrival and returned to chair at end of sessi'on  Transfers Overall transfer level: Needs assistance Equipment used: Rolling walker (2 wheeled);None Transfers: Sit to/from Stand Sit to Stand: Modified independent (Device/Increase time)         General transfer comment: able to complete sit to stand without walker, but feels more comfortable with RW when ambulating longer distances    Balance Overall balance  assessment: Needs assistance Sitting-balance support: Feet supported;No upper extremity supported Sitting balance-Leahy Scale: Good Sitting balance - Comments: during LB ADL engagement    Standing balance support: No upper extremity supported;During functional activity Standing balance-Leahy Scale: Fair Standing balance comment: able to complete ADLs at sink without support                           ADL either performed or assessed with clinical judgement   ADL Overall ADL's : Needs assistance/impaired     Grooming: Min guard;Standing;Wash/dry hands;Wash/dry face;Brushing hair               Lower Body Dressing: Min guard;Sit to/from stand Lower Body Dressing Details (indicate cue type and reason): figure 4 technique to reach B LEs, able to don house shoes for ambulation with technique             Functional mobility during ADLs: Min guard;Cueing for safety General ADL Comments: Pt continues to progress with therapy     Vision       Perception     Praxis      Cognition Arousal/Alertness: Awake/alert Behavior During Therapy: WFL for tasks assessed/performed Overall Cognitive Status: Within Functional Limits for tasks assessed                                          Exercises     Shoulder Instructions       General Comments Pt reports she does not have  to go up/down any steps at home due to stair lift (or alternately can walk around via outside to get to room where cats are fed). She denied need to practice stairs    Pertinent Vitals/ Pain       Pain Assessment: Faces Faces Pain Scale: Hurts little more Pain Location: headahce Pain Descriptors / Indicators: Aching;Discomfort Pain Intervention(s): Limited activity within patient's tolerance;Monitored during session;Repositioned;Premedicated before session  Home Living                                          Prior Functioning/Environment               Frequency  Min 2X/week        Progress Toward Goals  OT Goals(current goals can now be found in the care plan section)  Progress towards OT goals: Progressing toward goals  Acute Rehab OT Goals Patient Stated Goal: to get back home to her independence OT Goal Formulation: With patient Time For Goal Achievement: 10/08/19 Potential to Achieve Goals: Good  Plan Discharge plan remains appropriate    Co-evaluation                 AM-PAC OT "6 Clicks" Daily Activity     Outcome Measure   Help from another person eating meals?: None Help from another person taking care of personal grooming?: None Help from another person toileting, which includes using toliet, bedpan, or urinal?: A Little Help from another person bathing (including washing, rinsing, drying)?: A Little Help from another person to put on and taking off regular upper body clothing?: None Help from another person to put on and taking off regular lower body clothing?: A Little 6 Click Score: 21    End of Session    OT Visit Diagnosis: Other abnormalities of gait and mobility (R26.89);Pain   Activity Tolerance Patient tolerated treatment well   Patient Left Other (comment) (handed off to PT at end of session)   Nurse Communication Mobility status        Time: 5170-0174 OT Time Calculation (min): 25 min  Charges: OT General Charges $OT Visit: 1 Visit OT Treatments $Self Care/Home Management : 23-37 mins  [place]. Less Wool[place], COTA/L Acute Rehabilitation Services [place] 09/25/2019, 10:24 AM

## 2019-09-25 NOTE — Anesthesia Postprocedure Evaluation (Signed)
Anesthesia Post Note  Patient: Angela Davidson  Procedure(s) Performed: Lumbar three-four Posterior lumbar interbody fusion (N/A )     Patient location during evaluation: PACU Anesthesia Type: General Level of consciousness: awake and alert Pain management: pain level controlled Vital Signs Assessment: post-procedure vital signs reviewed and stable Respiratory status: spontaneous breathing, nonlabored ventilation, respiratory function stable and patient connected to nasal cannula oxygen Cardiovascular status: blood pressure returned to baseline and stable Postop Assessment: no apparent nausea or vomiting Anesthetic complications: no   No complications documented.  Last Vitals:  Vitals:   09/25/19 0733 09/25/19 1130  BP: 130/62 137/67  Pulse: 77 70  Resp: 16 18  Temp: 37.5 C (!) 36.3 C  SpO2: 97% 97%    Last Pain:  Vitals:   09/25/19 1130  TempSrc: Oral  PainSc:                  [name]

## 2019-09-25 NOTE — Progress Notes (Signed)
Physical Therapy Treatment Patient Details Name: Angela Davidson MRN: [id number] DOB: 04-Apr-1940 Today's Date: 09/25/2019    History of Present Illness 79 y.o. female admitted on 09/21/19 for L3-4 PLIF.  Pt on bedrest from day of surgery until 09/24/19.  Pt with significant PMH of anemia, back and neck surgery (2007, 1989).      PT Comments    Patient completing grooming at sink with OT on arrival. Assisted into bathroom prior to ambulation with pt demonstrating correct techniques to prevent twisting and bending. During gait training, educated to incr step length and foot clearance via focus on heelstrike. Patient very talkative and self-distracts and reverts to short, low steps rather quickly. Denied need for stair training. All goals met except for bed mobility (not attempted this session).     Follow Up Recommendations  No PT follow up     Equipment Recommendations  None recommended by PT    Recommendations for Other Services       Precautions / Restrictions Precautions Precautions: Back Precaution Comments: pt with good recall of precautions; no cues needed for adhering Required Braces or Orthoses:  (no brace needed order )    Mobility  Bed Mobility               General bed mobility comments: pt up with OT on arrival and returned to chair at end of sessi'on  Transfers   Equipment used: Rolling walker (2 wheeled) Transfers: Sit to/from Stand Sit to Stand: Modified independent (Device/Increase time)         General transfer comment: from standard toilet with grab bar  Ambulation/Gait Ambulation/Gait assistance: Supervision;Modified independent (Device/Increase time) Gait Distance (Feet): 200 Feet Assistive device: Rolling walker (2 wheeled) Gait Pattern/deviations: Step-through pattern;Decreased stride length Gait velocity: decreased   General Gait Details: no longer antalgic; no cues needed for posture or use of RW   Stairs             Wheelchair  Mobility    Modified Rankin (Stroke Patients Only)       Balance Overall balance assessment: Needs assistance Sitting-balance support: Feet supported;No upper extremity supported Sitting balance-Leahy Scale: Good     Standing balance support: No upper extremity supported;During functional activity Standing balance-Leahy Scale: Fair Standing balance comment: able to wash hands at sink without support                            Cognition Arousal/Alertness: Awake/alert Behavior During Therapy: WFL for tasks assessed/performed Overall Cognitive Status: Within Functional Limits for tasks assessed                                        Exercises      General Comments General comments (skin integrity, edema, etc.): Pt reports she does not have to go up/down any steps at home due to stair lift (or alternately can walk around via outside to get to room where cats are fed). She denied need to practice stairs      Pertinent Vitals/Pain Pain Assessment: No/denies pain    Home Living                      Prior Function            PT Goals (current goals can now be found in the care plan section) Acute Rehab  PT Goals Patient Stated Goal: to get back home to her independence Time For Goal Achievement: 10/08/19 Potential to Achieve Goals: Good Progress towards PT goals: Progressing toward goals (stair goal discontinued)    Frequency    Min 5X/week      PT Plan Current plan remains appropriate    Co-evaluation              AM-PAC PT "6 Clicks" Mobility   Outcome Measure  Help needed turning from your back to your side while in a flat bed without using bedrails?: A Little Help needed moving from lying on your back to sitting on the side of a flat bed without using bedrails?: A Little Help needed moving to and from a bed to a chair (including a wheelchair)?: None Help needed standing up from a chair using your arms (e.g.,  wheelchair or bedside chair)?: None Help needed to walk in hospital room?: None Help needed climbing 3-5 steps with a railing? : A Little 6 Click Score: 21    End of Session   Activity Tolerance: Patient tolerated treatment well Patient left: in chair;with call bell/phone within reach;with chair alarm set   PT Visit Diagnosis: Muscle weakness (generalized) (M62.81);Difficulty in walking, not elsewhere classified (R26.2);Other symptoms and signs involving the nervous system (R29.898)     Time: 5638-9373 PT Time Calculation (min) (ACUTE ONLY): 27 min  Charges:  $Gait Training: 8-22 mins $Self Care/Home Management: 8-22                      Arby Barrette, PT Pager [phone]    [name] 09/25/2019, 10:10 AM

## 2019-09-25 NOTE — Progress Notes (Signed)
Patient ID: Angela Davidson, female   DOB: 1940-09-26, 79 y.o.   MRN: [id number] BP 134/73 (BP Location: Left Arm)   Pulse 66   Temp 98.9 F (37.2 C) (Oral)   Resp 16   Ht 5' 0.5" (1.537 m)   Wt 68 kg   SpO2 97%   BMI 28.81 kg/m  Alert and oriented x 4, speech is clear and fluent Moving all extremities well Wound is clean, flat and dry Continue with PT,Ot

## 2019-09-25 NOTE — Op Note (Signed)
09/21/2019  7:25 PM  PATIENT:  Angela Davidson  79 y.o. 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  BLOOD ADMINISTERED:none  CELL SAVER GIVEN:none  COUNT:per nursng  DRAINS: none   SPECIMEN:  No Specimen  DICTATION: Angela Davidson is a 79 y.o. female whom was taken to the operating room intubated, and placed under a general anesthetic without difficulty. A foley catheter was placed under sterile conditions. Angela Davidson was positioned prone on a Jackson table with all pressure points properly padded.  Her lumbar region was prepped and draped in a sterile manner. I infiltrated 10cc's 1/2%lidocaine/1:2000,000 strength epinephrine into the planned incision. I opened the skin with a 10 blade and took the incision down to the thoracolumbar fascia. I exposed the lamina of L2,3,4, and 5 in a subperiosteal fashion bilaterally. I confirmed my location with an intraoperative xray.  I placed self retaining retractors and started the decompression.  I decompressed the spinal canal at L3/4 via a complete laminectomy and inferior facetectomy of L3 bilaterally. This allowed for a complete decompression of the L3 nerve roots through the lateral recess and the foramina at L3/4. The spinal canal was well decompressed with the complete laminectomy. I used the  drill and Kerrison punches. I denuded the dura in two locations and covered them with duragen. There was no substantial csf leak. PLIF's were performed at L3/4 in the same fashion. I opened the disc space with a 15 blade then used a variety of instruments to remove the disc and prepare the space for the arthrodesis. I used curettes, rongeurs, punches, shavers for the disc space, and rasps in the discetomy. I measured the disc space and placed 2 61mm x69mm cascadia titanium cages(Stryker) into the disc space(s).   I placed pedicle screws at L3, using fluoroscopic guidance. I drilled a pilot hole, then cannulated the pedicle with a drill at each site. I then tapped each pedicle, assessing each site for pedicle violations. No cutouts were appreciated. Screws (Nuvasive relign) were then placed at each site without difficulty. We attached rods and locking caps with the appropriate tools. The locking caps were secured with torque limited screwdrivers. Final films were performed and the final construct appeared to be in good position.  We closed the wound in a layered fashion. We approximated the thoracolumbar fascia, subcutaneous, and subcuticular planes with vicryl sutures. I approximated the skin edges with a running nylon suture I used an occlusive bandage for a sterile dressing.     PLAN OF CARE: Admit to inpatient   PATIENT DISPOSITION:  PACU - hemodynamically stable.   Delay start of Pharmacological VTE agent (>24hrs) due to surgical blood loss or risk of bleeding:  yes

## 2019-09-26 NOTE — Progress Notes (Signed)
Physical Therapy Treatment and Discharge Patient Details Name: Angela Davidson MRN: [id number] DOB: 1940/02/14 Today's Date: 09/26/2019    History of Present Illness 79 y.o. female admitted on 09/21/19 for L3-4 PLIF.  Pt on bedrest from day of surgery until 09/24/19.  Pt with significant PMH of anemia, back and neck surgery (2007, 1989).      PT Comments    Patient seen primarily to review bed mobility while maintaining back precautions and transfer training. Patient has adjustable craftmatic bed at home and demonstrated safe technique using HOB elevated to exit and enter bed. Patient observed up in room for purpose of assuring independence (to sink for washing hands, to bathroom, back to sink all while maintaining her back precautions and proper use of RW). RN agreed to allow pt to have bed alarm off and be up on her own with RW. (She will be going home alone). Pt reporting pain/discomfort due to constipation and assisted with ambulating in hall (non-billable time as she is modified independent).  Patient has met PT goals and is discharged from PT   Follow Up Recommendations  No PT follow up     Equipment Recommendations  None recommended by PT    Recommendations for Other Services       Precautions / Restrictions Precautions Precautions: Back Precaution Comments: pt with good recall of precautions; no cues needed for adhering Required Braces or Orthoses:  (no brace needed order )    Mobility  Bed Mobility Overal bed mobility: Needs Assistance Bed Mobility: Supine to Sit;Sit to Supine     Supine to sit: HOB elevated;Modified independent (Device/Increase time) (pt has craftmatic bed; fully raises HOB and pivots legs off ) Sit to supine: Modified independent (Device/Increase time);HOB elevated   General bed mobility comments: pt has craftmatic bed and is able to lean to support torso against HOB as she brings each leg up on to bed   Transfers Overall transfer level: Needs  assistance Equipment used: Rolling walker (2 wheeled);None Transfers: Sit to/from Stand Sit to Stand: Supervision;Modified independent (Device/Increase time)         General transfer comment: vc for safest use of RW as standing from bed; stood from toilet wiht grab bar and no cues  Ambulation/Gait Ambulation/Gait assistance: Modified independent (Device/Increase time) Gait Distance (Feet): 300 Feet Assistive device: Rolling walker (2 wheeled) Gait Pattern/deviations: Step-through pattern;Decreased stride length Gait velocity: decreased   General Gait Details: no longer antalgic; no cues needed for posture or use of RW   Stairs             Wheelchair Mobility    Modified Rankin (Stroke Patients Only)       Balance Overall balance assessment: Modified Independent Sitting-balance support: Feet supported;No upper extremity supported Sitting balance-Leahy Scale: Good     Standing balance support: No upper extremity supported;During functional activity Standing balance-Leahy Scale: Good Standing balance comment: able to wash hands at sink without support; reaching for soap and paper towels without bending/twisting                            Cognition Arousal/Alertness: Awake/alert Behavior During Therapy: WFL for tasks assessed/performed Overall Cognitive Status: Within Functional Limits for tasks assessed                                        Exercises  General Comments        Pertinent Vitals/Pain Pain Assessment: Faces Faces Pain Scale: Hurts little more Pain Location: abdomen/constipation Pain Descriptors / Indicators: Pressure Pain Intervention(s): Monitored during session;Other (comment) (encouraged ambulation and fluids)    Home Living                      Prior Function            PT Goals (current goals can now be found in the care plan section) Acute Rehab PT Goals Patient Stated Goal: to get back  home to her independence Time For Goal Achievement: 10/08/19 Potential to Achieve Goals: Good Progress towards PT goals: Goals met/education completed, patient discharged from PT    Frequency    Min 5X/week      PT Plan Current plan remains appropriate    Co-evaluation              AM-PAC PT "6 Clicks" Mobility   Outcome Measure  Help needed turning from your back to your side while in a flat bed without using bedrails?: None Help needed moving from lying on your back to sitting on the side of a flat bed without using bedrails?: A Little Help needed moving to and from a bed to a chair (including a wheelchair)?: None Help needed standing up from a chair using your arms (e.g., wheelchair or bedside chair)?: None Help needed to walk in hospital room?: None Help needed climbing 3-5 steps with a railing? : A Little 6 Click Score: 22    End of Session   Activity Tolerance: Patient tolerated treatment well Patient left: with call bell/phone within reach;in bed (RN agreed, OK for pt to be up modified indep; no alarm) Nurse Communication: Mobility status (OK to be up independently) PT Visit Diagnosis: Muscle weakness (generalized) (M62.81);Difficulty in walking, not elsewhere classified (R26.2);Other symptoms and signs involving the nervous system (R29.898)   PT Discharge Note  Patient is being discharged from PT services secondary to:  Marland Kitchen Goals met and no further therapy needs identified.  Please see latest Therapy Progress Note for current level of functioning and progress toward goals.  Progress and discharge plan and discussed with patient/caregiver and they  . Agree   Time: [phone] (full time not billable) PT Time Calculation (min) (ACUTE ONLY): 43 min  Charges:  $Therapeutic Activity: 8-22 mins $Self Care/Home Management: 8-22                      Arby Barrette, PT Pager [phone]    [name] 09/26/2019, 9:50 AM

## 2019-09-26 NOTE — Progress Notes (Signed)
Patient ID: Angela Davidson, female   DOB: Jul 10, 1940, 79 y.o.   MRN: [id number] BP (!) 135/58 (BP Location: Left Arm)   Pulse 86   Temp 98.3 F (36.8 C) (Oral)   Resp 18   Ht 5' 0.5" (1.537 m)   Wt 68 kg   SpO2 100%   BMI 28.81 kg/m  Alert and oriented x4, moving well in her room Dressing is clean, dry, no signs of infection Will discharge tomorrow

## 2019-09-27 MED ORDER — BUTALBITAL-APAP-CAFFEINE 50-325-40 MG PO TABS: 1.0000 | ORAL_TABLET | Freq: Four times a day (QID) | ORAL | 0 refills | Status: AC | PRN

## 2019-09-27 MED ORDER — TIZANIDINE HCL 4 MG PO TABS: 4.0000 mg | ORAL_TABLET | Freq: Four times a day (QID) | ORAL | 0 refills | Status: AC | PRN

## 2019-09-27 NOTE — Discharge Summary (Signed)
Physician Discharge Summary  Patient ID: Angela Davidson MRN: [id number] DOB/AGE: 08-04-1940 79 y.o.  Admit date: 09/21/2019 Discharge date: 09/27/2019  Admission Diagnoses:Lumbar adjacent segment disease, lumbar stenosis L3/4 Spondylolisthesis Discharge Diagnoses: same Active Problems:   Lumbar adjacent segment disease with spondylolisthesis   Discharged Condition: good  Hospital Course: Angela Davidson presented with stenosis and was admitted for decompression of the L3/4 level. She had an uneventful surgery and post op has had improvement in the pain she reported. Her wound is clean, dry, and without signs of infection. She is ambulating, voiding, and tolerating a regular diet.   Treatments: surgery: Lumbar three-four Posterior lumbar interbody fusion, autograft morsels packed into the disc space and cages(cascadia, stryker) Laminectomy L3 beyond the needed exposure for a Plif Non segmental pedicle screw fixation L3/4, nuvasive relign  Discharge Exam: Blood pressure 113/76, pulse 74, temperature 98.7 F (37.1 C), temperature source Oral, resp. rate 18, height 5' 0.5" (1.537 m), weight 68 kg, SpO2 98 %. General appearance: alert, cooperative, appears stated age and mild distress  Disposition: Discharge disposition: 01-Home or Self Care      Spondylolisthesis, Lumbar region  Allergies as of 09/27/2019      Reactions   Morphine And Related Nausea Only   Headaches   Other    VICRYL Suture/surgical thread (blisters)   Codeine Nausea Only   Hydrocodone Nausea Only      Medication List    TAKE these medications   Biotin 10000 MCG Tabs Take 10,000 mcg by mouth at bedtime.   buPROPion 100 MG tablet Commonly known as: WELLBUTRIN Take 100 mg by mouth at bedtime.   butalbital-acetaminophen-caffeine 50-325-40 MG tablet Commonly known as: FIORICET Take 1 tablet by mouth every 6 (six) hours as needed for headache.   CALCIUM 600+D PO Take 1 tablet by mouth at bedtime.    cephALEXin 500 MG capsule Commonly known as: KEFLEX Take 1 capsule (500 mg total) by mouth 2 (two) times daily.   escitalopram 10 MG tablet Commonly known as: LEXAPRO Take 10 mg by mouth at bedtime.   ferrous sulfate 325 (65 FE) MG tablet Take 325 mg by mouth at bedtime.   furosemide 20 MG tablet Commonly known as: LASIX Take 20 mg by mouth daily as needed (fluid retention.).   LORazepam 1 MG tablet Commonly known as: ATIVAN Take 1 mg by mouth at bedtime.   tiZANidine 4 MG tablet Commonly known as: ZANAFLEX Take 1 tablet (4 mg total) by mouth every 6 (six) hours as needed for muscle spasms.   vitamin B-12 1000 MCG tablet Commonly known as: CYANOCOBALAMIN Take 1,000 mcg by mouth at bedtime.   Vitamin D3 125 MCG (5000 UT) Tabs Take 5,000 Units by mouth at bedtime.       Follow-up Information    Ashok Pall, MD Follow up in 2 week(s).   Specialty: Neurosurgery Why: call to make an appointment for suture removal Contact information: [address] [place] [address] [phone]               Signed: Ashok Pall 09/27/2019, 3:47 PM

## 2019-09-27 NOTE — TOC Transition Note (Signed)
Transition of Care [facility]) - CM/SW Discharge Note   Patient Details  Name: Angela Davidson MRN: [id number] Date of Birth: 08-19-1940  [facility] CM/SW Contact:  Pollie Friar, RN Phone Number: 09/27/2019, 11:19 AM   Clinical Narrative:    Pt discharging home today. Pt states his brother and his wife are going to stay with her and provide needed assistance at home.  Pt has: walkers/ 3 in 1/ hospital bed/ canes/ rails by the commode/ lift chair/ handicap bathroom.  No f/u per PT/OT and no DME needs.  Pt states brother can provide needed transportation.    Final next level of care: Home/Self Care Barriers to Discharge: No Barriers Identified   Patient Goals and CMS Choice        Discharge Placement                       Discharge Plan and Services                                     Social Determinants of Health (SDOH) Interventions     Readmission Risk Interventions No flowsheet data found.

## 2019-09-27 NOTE — Progress Notes (Signed)
Patient being discharged home with self care. Education and information provided to patient. All belongings with patient. Patient leaving unit via wheelchair.

## 2021-09-09 IMAGING — RF DG C-ARM 1-60 MIN
1 series · 8 of 8 positions shown · non-contrast
Comparison: Radiographs of the lumbar spine 08/27/2019. MRI of the
lumbar spine 08/17/2019.

CLINICAL DATA: Surgery, elective. Additional history provided:
Lumbar 3-4 posterior lumbar interbody fusion. Provided fluoroscopy
time 46.3 seconds, 28.81 mGy.

EXAM:
LUMBAR SPINE - 2-3 VIEW; DG C-ARM 1-60 MIN

[Series 1: run · 8 of 8 slices shown]
[im 1/8]
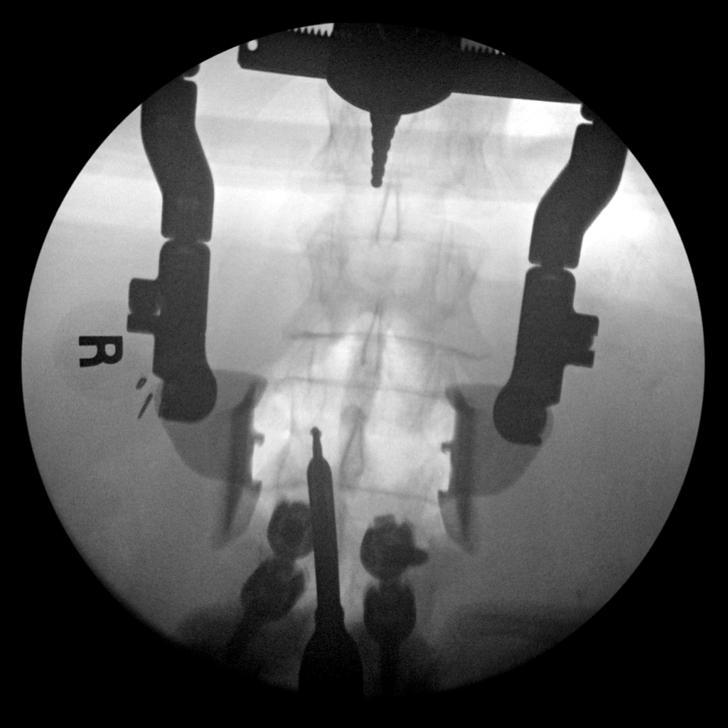
[im 2/8]
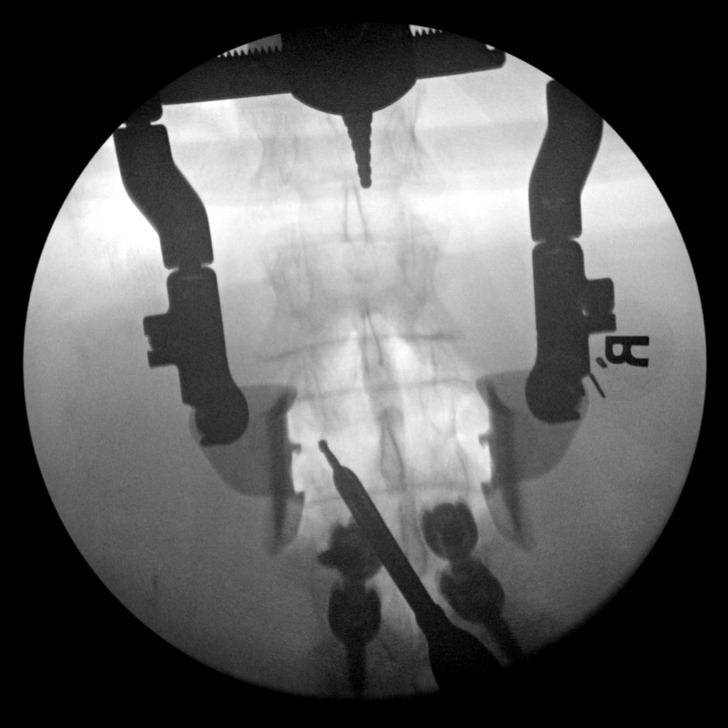
[im 3/8]
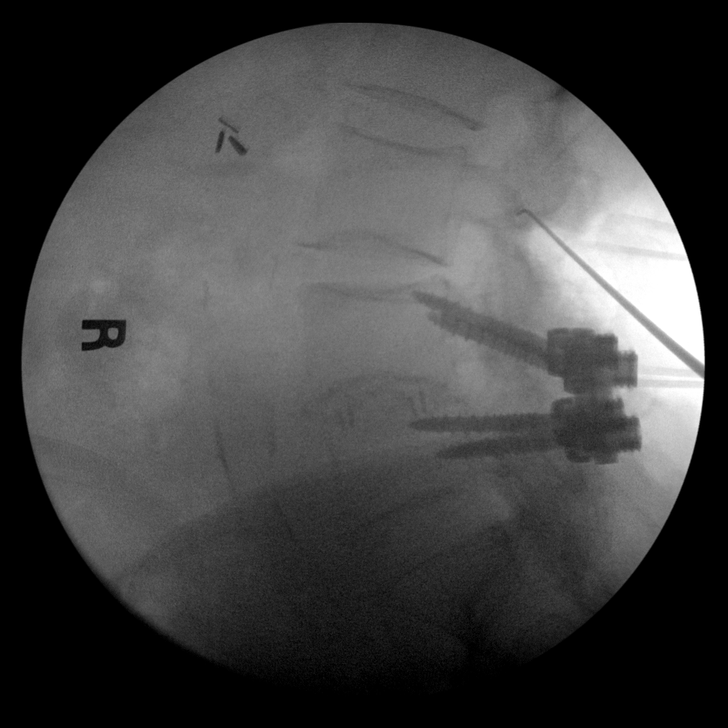
[im 4/8]
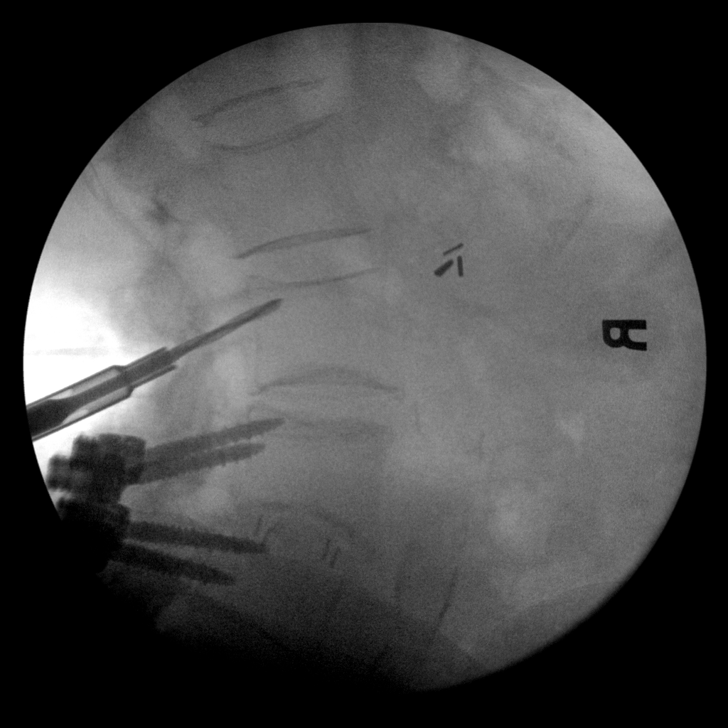
[im 5/8]
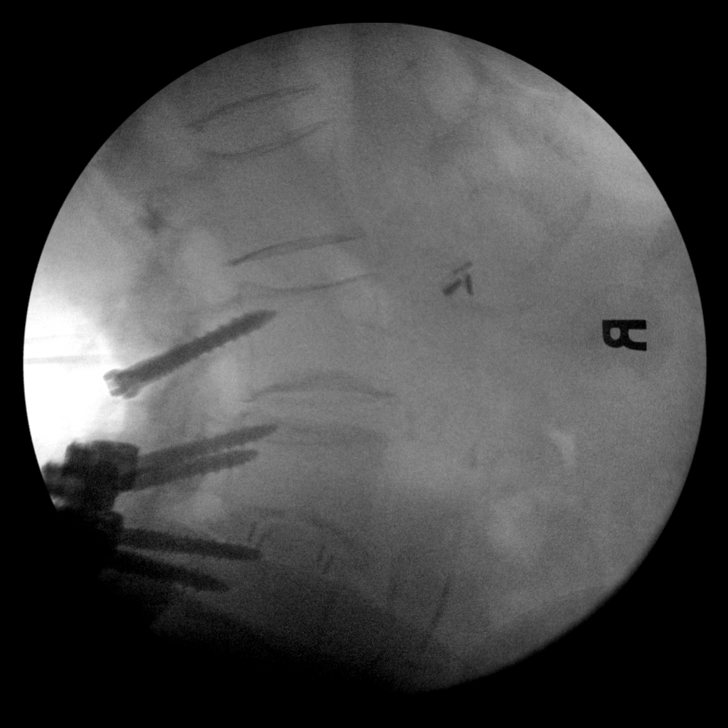
[im 6/8]
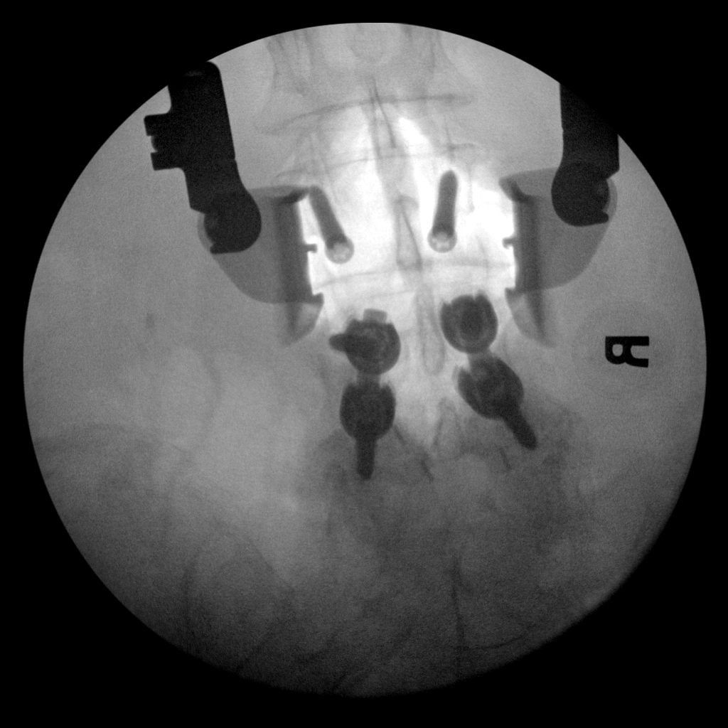
[im 7/8]
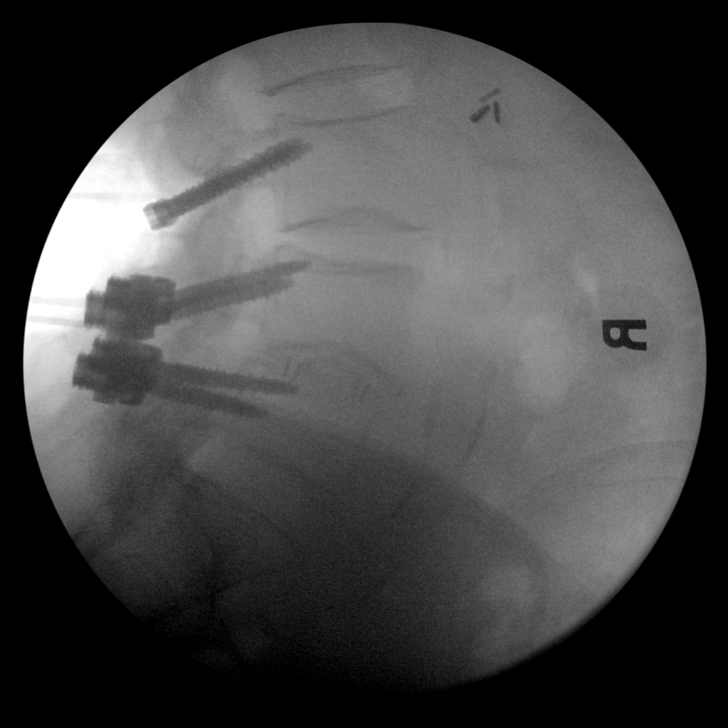
[im 8/8]
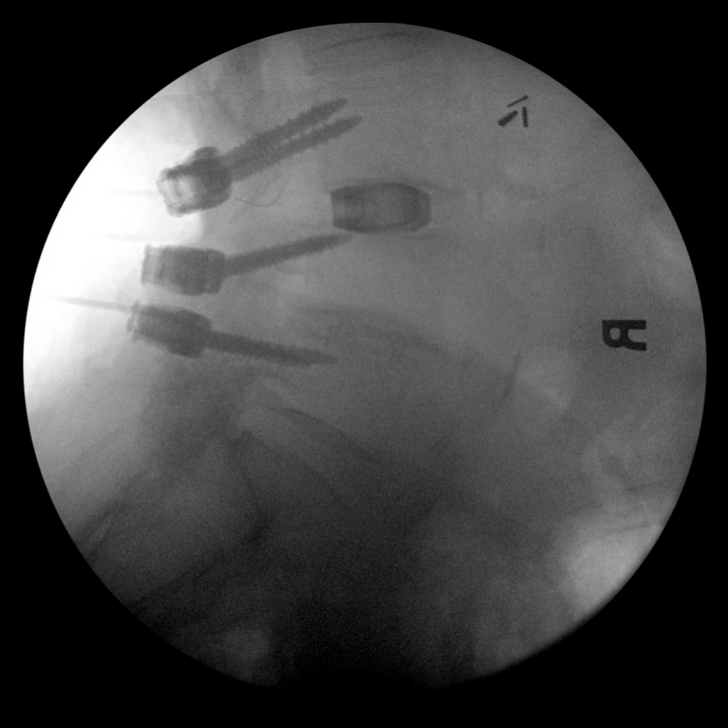

[8 of 8 positions shown; findings below may reference images not displayed]

FINDINGS: PA and lateral view intraoperative fluoroscopic images of the lumbar
spine are submitted, 8 images total. On the final image, there are
new bilateral pedicle screws within the L3 vertebral body as well as
a new L3-L4 interbody spacer. Redemonstrated bilateral pedicle
screws within the L4 and L5 vertebrae. Vertical interconnecting rods
are not present at the time of the most recent images.
IMPRESSION: Eight intraoperative fluoroscopic images of the lumbar spine as
described.

## 2023-02-01 ENCOUNTER — Telehealth: Payer: Self-pay | Admitting: Gastroenterology

## 2023-02-01 NOTE — Telephone Encounter (Signed)
 Good afternoon Dr. Charlanne,   Patient called stating she wanted to make an appointment with you, she stated that she was a patient of yours years ago when you were practicing in [place]. After looking at patients chart she was recently seen by  Dr. Murriel with [facility]. 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  Patient is wishing to transfer her care to you, due to the history she had with you in the past and states you took care of her mother as well and she trust you and your recommendations.  Patient records are in epic, will you please review and advise on scheduling patient?  Thank you

## 2023-02-02 NOTE — Telephone Encounter (Signed)
 I have reviewed her recent colonoscopy/biopsies-showing collagenous colitis She was started on budesonide by Dr. Lise Ridge-  She is doing great as per Dr. Ruby Corporal notes I have also reviewed notes from [facility]  I would suggest to continue care with Dr. Lise Ridge for now.  She is in good hands.  If she has any problems in future, she can always let us  know RG

## 2023-02-04 NOTE — Telephone Encounter (Signed)
Good afternoon Dr. Chales Abrahams   Patient called and stated that she does not have a continuous care with her previous GI. Patient stated that Dr. Littie Deeds only prescribed her 60 pills and told her that should take care of her problem. Patient also stated she is continuing to have Diarrhea uncontrollably and is wondering if you could please reconsider your decision. Patient is aware that she has been denied for now. Would you please advise on scheduling.     Thank you.

## 2023-02-08 NOTE — Telephone Encounter (Signed)
Please schedule her in APP clinic RG

## 2023-02-09 NOTE — Telephone Encounter (Signed)
I called patient to get her scheduled but she stated that she did not wan tot wait until April to be seen by Dr. Chales Abrahams. Patient stated she will look else where to be seen sooner.
